# Patient Record
Sex: Male | Born: 1955 | Race: White | Hispanic: No | Marital: Married | Smoking: Former smoker
Health system: Southern US, Community
[De-identification: ages and names within clinical notes are randomized; demographics above are authoritative.]

## PROBLEM LIST (undated history)

## (undated) DIAGNOSIS — M1990 Unspecified osteoarthritis, unspecified site: Secondary | ICD-10-CM

## (undated) DIAGNOSIS — Z8489 Family history of other specified conditions: Secondary | ICD-10-CM

## (undated) DIAGNOSIS — Z9889 Other specified postprocedural states: Secondary | ICD-10-CM

## (undated) DIAGNOSIS — R112 Nausea with vomiting, unspecified: Secondary | ICD-10-CM

## (undated) DIAGNOSIS — Z8709 Personal history of other diseases of the respiratory system: Secondary | ICD-10-CM

## (undated) DIAGNOSIS — Z973 Presence of spectacles and contact lenses: Secondary | ICD-10-CM

## (undated) HISTORY — PX: TONSILLECTOMY: SUR1361

## (undated) HISTORY — PX: SHOULDER ARTHROSCOPY: SHX128

## (undated) HISTORY — PX: ELBOW ARTHROSCOPY: SUR87

## (undated) HISTORY — PX: CARPAL TUNNEL RELEASE: SHX101

## (undated) HISTORY — PX: OTHER SURGICAL HISTORY: SHX169

---

## 2008-10-10 ENCOUNTER — Ambulatory Visit: Payer: Self-pay | Admitting: Internal Medicine

## 2008-10-11 ENCOUNTER — Telehealth: Payer: Self-pay | Admitting: Internal Medicine

## 2008-10-14 ENCOUNTER — Telehealth: Payer: Self-pay | Admitting: Internal Medicine

## 2008-10-18 ENCOUNTER — Encounter: Payer: Self-pay | Admitting: Internal Medicine

## 2008-10-18 ENCOUNTER — Ambulatory Visit: Payer: Self-pay | Admitting: Internal Medicine

## 2008-10-19 ENCOUNTER — Encounter: Payer: Self-pay | Admitting: Internal Medicine

## 2009-02-27 ENCOUNTER — Ambulatory Visit: Payer: Self-pay | Admitting: Specialist

## 2010-08-27 ENCOUNTER — Ambulatory Visit: Payer: Self-pay | Admitting: Family Medicine

## 2010-10-12 ENCOUNTER — Ambulatory Visit: Payer: Self-pay | Admitting: General Practice

## 2010-11-02 ENCOUNTER — Ambulatory Visit: Payer: Self-pay | Admitting: General Practice

## 2011-01-04 LAB — TSH: TSH: 2.52 u[IU]/mL (ref <=5.90)

## 2013-08-11 ENCOUNTER — Encounter: Payer: Self-pay | Admitting: Internal Medicine

## 2014-05-07 ENCOUNTER — Encounter: Payer: Self-pay | Admitting: Internal Medicine

## 2015-03-14 LAB — BASIC METABOLIC PANEL
BUN: 17 mg/dL (ref 4–21)
Creatinine: 0.9 mg/dL (ref 0.6–1.3)
Glucose: 100 mg/dL
Potassium: 4.9 mmol/L (ref 3.4–5.3)
Sodium: 141 mmol/L (ref 137–147)

## 2015-03-14 LAB — LIPID PANEL
Cholesterol: 198 mg/dL (ref 0–200)
HDL: 41 mg/dL (ref 35–70)
LDL Cholesterol: 128 mg/dL
LDl/HDL Ratio: 3.1
Triglycerides: 147 mg/dL (ref 40–160)

## 2015-03-14 LAB — PSA: PSA: 1.3

## 2015-04-25 ENCOUNTER — Ambulatory Visit
Admission: RE | Admit: 2015-04-25 | Discharge: 2015-04-25 | Disposition: A | Discharge status: Home / Self Care | Payer: 59 | Source: Ambulatory Visit | Attending: Gastroenterology | Admitting: Gastroenterology

## 2015-04-25 ENCOUNTER — Ambulatory Visit: Payer: 59 | Admitting: Anesthesiology

## 2015-04-25 ENCOUNTER — Encounter: Payer: Self-pay | Admitting: *Deleted

## 2015-04-25 ENCOUNTER — Encounter
Admission: RE | Disposition: A | Discharge status: Home / Self Care | Payer: Self-pay | Source: Ambulatory Visit | Attending: Gastroenterology

## 2015-04-25 DIAGNOSIS — Z87891 Personal history of nicotine dependence: Secondary | ICD-10-CM | POA: Insufficient documentation

## 2015-04-25 DIAGNOSIS — D120 Benign neoplasm of cecum: Secondary | ICD-10-CM | POA: Insufficient documentation

## 2015-04-25 DIAGNOSIS — D125 Benign neoplasm of sigmoid colon: Secondary | ICD-10-CM | POA: Insufficient documentation

## 2015-04-25 DIAGNOSIS — Z86010 Personal history of colonic polyps: Secondary | ICD-10-CM | POA: Diagnosis not present

## 2015-04-25 DIAGNOSIS — D122 Benign neoplasm of ascending colon: Secondary | ICD-10-CM | POA: Diagnosis not present

## 2015-04-25 DIAGNOSIS — Z79899 Other long term (current) drug therapy: Secondary | ICD-10-CM | POA: Diagnosis not present

## 2015-04-25 DIAGNOSIS — Z1211 Encounter for screening for malignant neoplasm of colon: Secondary | ICD-10-CM | POA: Insufficient documentation

## 2015-04-25 HISTORY — PX: COLONOSCOPY: SHX5424

## 2015-04-25 SURGERY — COLONOSCOPY
Anesthesia: General

## 2015-04-25 MED ORDER — SODIUM CHLORIDE 0.9 % IV SOLN: INTRAVENOUS | Status: DC

## 2015-04-25 MED ORDER — MIDAZOLAM HCL 2 MG/2ML IJ SOLN
INTRAMUSCULAR | Status: DC | PRN
  Administered 2015-04-25: 1 mg via INTRAVENOUS

## 2015-04-25 MED ORDER — PROPOFOL INFUSION 10 MG/ML OPTIME
INTRAVENOUS | Status: DC | PRN
  Administered 2015-04-25: 180 ug/kg/min via INTRAVENOUS

## 2015-04-25 MED ORDER — SODIUM CHLORIDE 0.9 % IV SOLN
INTRAVENOUS | Status: DC
  Administered 2015-04-25: 11:00:00 via INTRAVENOUS

## 2015-04-25 MED ORDER — FENTANYL CITRATE (PF) 100 MCG/2ML IJ SOLN
INTRAMUSCULAR | Status: DC | PRN
  Administered 2015-04-25: 50 ug via INTRAVENOUS

## 2015-04-25 NOTE — Anesthesia Preprocedure Evaluation (Signed)
Anesthesia Evaluation  Patient identified by MRN, date of birth, ID band Patient awake    Reviewed: Allergy & Precautions, NPO status , Patient's Chart, lab work & pertinent test results  Airway Mallampati: II  TM Distance: >3 FB Neck ROM: Full    Dental  (+) Teeth Intact   Pulmonary former smoker (quit x 28 yrs),          Cardiovascular     Neuro/Psych    GI/Hepatic   Endo/Other    Renal/GU      Musculoskeletal   Abdominal   Peds  Hematology   Anesthesia Other Findings   Reproductive/Obstetrics                             Anesthesia Physical Anesthesia Plan  ASA: II  Anesthesia Plan: General   Post-op Pain Management:    Induction: Intravenous  Airway Management Planned: Nasal Cannula  Additional Equipment:   Intra-op Plan:   Post-operative Plan:   Informed Consent: I have reviewed the patients History and Physical, chart, labs and discussed the procedure including the risks, benefits and alternatives for the proposed anesthesia with the patient or authorized representative who has indicated his/her understanding and acceptance.     Plan Discussed with:   Anesthesia Plan Comments:         Anesthesia Quick Evaluation

## 2015-04-25 NOTE — Anesthesia Postprocedure Evaluation (Signed)
  Anesthesia Post-op Note  Patient: Spencer Walker  Procedure(s) Performed: Procedure(s): COLONOSCOPY (N/A)  Anesthesia type:General  Patient location: PACU  Post pain: Pain level controlled  Post assessment: Post-op Vital signs reviewed, Patient's Cardiovascular Status Stable, Respiratory Function Stable, Patent Airway and No signs of Nausea or vomiting  Post vital signs: Reviewed and stable  Last Vitals:  Filed Vitals:   04/25/15 1300  BP: 127/88  Pulse: 57  Temp:   Resp: 18    Level of consciousness: awake, alert  and patient cooperative  Complications: No apparent anesthesia complications

## 2015-04-25 NOTE — Op Note (Signed)
[facility] Gastroenterology Patient Name: Spencer Walker Procedure Date: 04/25/2015 11:46 AM MRN: [id number] Account #: [id number] Date of Birth: [birth year] Admit Type: Outpatient Age: 59 Room: [facility] ENDO ROOM 4 Gender: Male Note Status: Finalized Procedure:         Colonoscopy Indications:       High risk colon cancer surveillance: Personal history of                     colonic polyps Providers:         Lucilla Lame, MD Referring MD:      Kirstie Peri. Caryn Section, MD (Referring MD) Medicines:         Propofol per Anesthesia Complications:     No immediate complications. Procedure:         Pre-Anesthesia Assessment:                    - Prior to the procedure, a History and Physical was                     performed, and patient medications and allergies were                     reviewed. The patient's tolerance of previous anesthesia                     was also reviewed. The risks and benefits of the procedure                     and the sedation options and risks were discussed with the                     patient. All questions were answered, and informed consent                     was obtained. Prior Anticoagulants: The patient has taken                     no previous anticoagulant or antiplatelet agents. ASA                     Grade Assessment: II - A patient with mild systemic                     disease. After reviewing the risks and benefits, the                     patient was deemed in satisfactory condition to undergo                     the procedure.                    After obtaining informed consent, the colonoscope was                     passed under direct vision. Throughout the procedure, the                     patient's blood pressure, pulse, and oxygen saturations                     were monitored continuously. The Olympus CF-Q160AL  colonoscope (S#. [id number]) was introduced through the anus                     and  advanced to the the cecum, identified by appendiceal                     orifice and ileocecal valve. The colonoscopy was performed                     without difficulty. The patient tolerated the procedure                     well. The quality of the bowel preparation was excellent. Findings:      The perianal and digital rectal examinations were normal.      A 3 mm polyp was found in the cecum. The polyp was sessile. The polyp       was removed with a cold biopsy forceps. Resection and retrieval were       complete.      A 7 mm polyp was found in the ascending colon. The polyp was sessile.       The polyp was removed with a cold snare. Resection and retrieval were       complete.      A 4 mm polyp was found in the sigmoid colon. The polyp was sessile. The       polyp was removed with a cold biopsy forceps. Resection and retrieval       were complete. Impression:        - One 3 mm polyp in the cecum. Resected and retrieved.                    - One 7 mm polyp in the ascending colon. Resected and                     retrieved.                    - One 4 mm polyp in the sigmoid colon. Resected and                     retrieved. Recommendation:    - Await pathology results.                    - Repeat colonoscopy in 5 years for surveillance. Procedure Code(s): --- Professional ---                    [phone], Colonoscopy, flexible; with removal of tumor(s),                     polyp(s), or other lesion(s) by snare technique                    45380, 65, Colonoscopy, flexible; with biopsy, single or                     multiple Diagnosis Code(s): --- Professional ---                    Z86.010, Personal history of colonic polyps                    D12.0, Benign neoplasm of cecum  D12.2, Benign neoplasm of ascending colon                    D12.5, Benign neoplasm of sigmoid colon CPT copyright 2014 American Medical Association. All rights reserved. The codes documented in  this report are preliminary and upon coder review may  be revised to meet current compliance requirements. Lucilla Lame, MD 04/25/2015 12:15:21 PM This report has been signed electronically. Number of Addenda: 0 Note Initiated On: 04/25/2015 11:46 AM Scope Withdrawal Time: 0 hours 9 minutes 31 seconds  Total Procedure Duration: 0 hours 12 minutes 20 seconds       [facility]

## 2015-04-25 NOTE — Anesthesia Procedure Notes (Signed)
Performed by: Jonna Clark Pre-anesthesia Checklist: Patient identified, Emergency Drugs available, Patient being monitored, Suction available and Timeout performed Patient Re-evaluated:Patient Re-evaluated prior to inductionOxygen Delivery Method: Nasal cannula Preoxygenation: Pre-oxygenation with 100% oxygen Intubation Type: IV induction

## 2015-04-25 NOTE — Transfer of Care (Signed)
Immediate Anesthesia Transfer of Care Note  Patient: Spencer Walker  Procedure(s) Performed: Procedure(s): COLONOSCOPY (N/A)  Patient Location: PACU and Endoscopy Unit  Anesthesia Type:General  Level of Consciousness: awake, alert  and oriented  Airway & Oxygen Therapy: Patient Spontanous Breathing and Patient connected to nasal cannula oxygen  Post-op Assessment: Report given to RN and Post -op Vital signs reviewed and stable  Post vital signs: Reviewed and stable  Last Vitals:  Filed Vitals:   04/25/15 1043  BP: 141/99  Pulse: 68  Temp: 36.6 C  Resp: 14    Complications: No apparent anesthesia complications

## 2015-04-25 NOTE — H&P (Signed)
  [facility]  [address], [place], [place] [address] Phone: [phone] Fax : [phone]  Primary Care Physician:  Lelon Huh, MD Primary Gastroenterologist:  Dr. Allen Norris  Pre-Procedure History & Physical: HPI:  Spencer Walker is a 59 y.o. male is here for an colonoscopy.   History reviewed. No pertinent past medical history.  Past Surgical History  Procedure Laterality Date  . Carpal tunnel release    . Shoulder arthroscopy    . Elbow arthroscopy      Prior to Admission medications   Medication Sig Start Date End Date Taking? Authorizing Provider  sildenafil (VIAGRA) 50 MG tablet Take 50 mg by mouth daily as needed for erectile dysfunction.   Yes Historical Provider, MD  zolpidem (AMBIEN) 10 MG tablet Take 10 mg by mouth at bedtime as needed. 04/12/15  Yes Historical Provider, MD    Allergies as of 04/11/2015  . (Not on File)    History reviewed. No pertinent family history.  History   Social History  . Marital Status: Married    Spouse Name: N/A  . Number of Children: N/A  . Years of Education: N/A   Occupational History  . Not on file.   Social History Main Topics  . Smoking status: Former Research scientist (life sciences)  . Smokeless tobacco: Never Used  . Alcohol Use: No  . Drug Use: No  . Sexual Activity: Not on file   Other Topics Concern  . Not on file   Social History Narrative  . No narrative on file    Review of Systems: See HPI, otherwise negative ROS  Physical Exam: BP 141/99 mmHg  Pulse 68  Temp(Src) 97.8 F (36.6 C) (Tympanic)  Resp 14  Ht 6\' 2"  (1.88 m)  Wt 246 lb (111.585 kg)  BMI 31.57 kg/m2  SpO2 100% General:   Alert,  pleasant and cooperative in NAD Head:  Normocephalic and atraumatic. Neck:  Supple; no masses or thyromegaly. Lungs:  Clear throughout to auscultation.    Heart:  Regular rate and rhythm. Abdomen:  Soft, nontender and nondistended. Normal bowel sounds, without guarding, and without rebound.     Neurologic:  Alert and  oriented x4;  grossly normal neurologically.  Impression/Plan: Spencer Walker is here for an colonoscopy to be performed for history of colon polyps.  Risks, benefits, limitations, and alternatives regarding  colonoscopy have been reviewed with the patient.  Questions have been answered.  All parties agreeable.   [facility], MD  04/25/2015, 11:08 AM

## 2015-04-26 LAB — SURGICAL PATHOLOGY

## 2015-05-02 ENCOUNTER — Encounter: Payer: Self-pay | Admitting: Gastroenterology

## 2015-05-22 ENCOUNTER — Other Ambulatory Visit: Payer: Self-pay | Admitting: Family Medicine

## 2015-05-22 NOTE — Telephone Encounter (Signed)
Please call in:  [facility] [phone]   [place]  1 hour ago   (9:42 AM)               Requested Medications     Medication name:  Name from pharmacy:  zolpidem (AMBIEN) 10 MG tablet ZOLPIDEM TARTRATE 10 MG TABLET    Sig: TAKE 1 TABLET AT BEDTIME AS NEEDED   #30, rf x 5

## 2015-05-23 ENCOUNTER — Other Ambulatory Visit: Payer: Self-pay | Admitting: *Deleted

## 2015-05-23 MED ORDER — ZOLPIDEM TARTRATE 10 MG PO TABS: 10.0000 mg | ORAL_TABLET | Freq: Every evening | ORAL | Status: DC | PRN

## 2015-05-23 NOTE — Telephone Encounter (Signed)
Rx phoned into pharmacy. [facility]

## 2015-05-23 NOTE — Telephone Encounter (Signed)
Pt stated that [facility] would no longer be excepting [facility]'s insurance starting in July so pt request that we send the RX for Ambien 10 mg to [facility] on [address]. Thanks TNP

## 2015-05-23 NOTE — Telephone Encounter (Signed)
Rx phoned into pharmacy.

## 2015-11-24 ENCOUNTER — Encounter: Payer: Self-pay | Admitting: Family Medicine

## 2015-11-24 ENCOUNTER — Ambulatory Visit (INDEPENDENT_AMBULATORY_CARE_PROVIDER_SITE_OTHER): Payer: 59 | Admitting: Family Medicine

## 2015-11-24 ENCOUNTER — Other Ambulatory Visit: Payer: Self-pay | Admitting: *Deleted

## 2015-11-24 VITALS — BP 134/70 | HR 76 | Temp 97.9°F | Resp 16 | Ht 74.0 in | Wt 254.0 lb

## 2015-11-24 DIAGNOSIS — J069 Acute upper respiratory infection, unspecified: Secondary | ICD-10-CM | POA: Diagnosis not present

## 2015-11-24 MED ORDER — ZOLPIDEM TARTRATE 10 MG PO TABS: 10.0000 mg | ORAL_TABLET | Freq: Every evening | ORAL | Status: DC | PRN

## 2015-11-24 MED ORDER — HYDROCODONE-HOMATROPINE 5-1.5 MG/5ML PO SYRP: ORAL_SOLUTION | ORAL | Status: DC

## 2015-11-24 NOTE — Telephone Encounter (Signed)
Please call in zolpidem  

## 2015-11-24 NOTE — Patient Instructions (Signed)
May use Mucinex for an expectorant.

## 2015-11-24 NOTE — Progress Notes (Signed)
Subjective:     Patient ID: Spencer Walker, male   DOB: 05-Sep-1956, 59 y.o.   MRN: [id number]  HPI  Chief Complaint  Patient presents with  . URI    X 1 week. Patient reports that he has had a productive cough with some wheezing and some shortness of breath. Denies fever. Patient reports that he has been taking OTC Alka seltzer plus which has not been helping.   States his sinus congestion has improved but he continues to have a dry>productive cough which is keeping him up at night:"I feel hot". Consequently feels malaise, weak, and his chest discomfort with cough. Wishes to be checked for pneumonia.   Review of Systems  Constitutional: Negative for fever and chills.       Objective:   Physical Exam  Constitutional: He appears well-developed and well-nourished. No distress.  Ears: T.M's intact without inflammation Throat: tonsils absent, no erythema Neck: no cervical adenopathy Lungs: clear     Assessment:    1. Upper respiratory infection - HYDROcodone-homatropine (HYCODAN) 5-1.5 MG/5ML syrup; 5 ml 4-6 hours as needed for cough  Dispense: 240 mL; Refill: 0    Plan:    Call if not continuing to improve over the next week.

## 2015-11-24 NOTE — Telephone Encounter (Signed)
Rx called in to pharmacy. 

## 2015-11-29 ENCOUNTER — Other Ambulatory Visit: Payer: Self-pay | Admitting: Family Medicine

## 2016-03-14 ENCOUNTER — Encounter: Payer: Self-pay | Admitting: Family Medicine

## 2016-03-14 ENCOUNTER — Ambulatory Visit (INDEPENDENT_AMBULATORY_CARE_PROVIDER_SITE_OTHER): Payer: BLUE CROSS/BLUE SHIELD | Admitting: Family Medicine

## 2016-03-14 VITALS — BP 112/78 | HR 67 | Temp 97.7°F | Resp 16 | Ht 73.75 in | Wt 246.0 lb

## 2016-03-14 DIAGNOSIS — Z0000 Encounter for general adult medical examination without abnormal findings: Secondary | ICD-10-CM

## 2016-03-14 DIAGNOSIS — G4700 Insomnia, unspecified: Secondary | ICD-10-CM

## 2016-03-14 NOTE — Progress Notes (Signed)
Patient: Spencer Walker, Male    DOB: 1956-08-11, 60 y.o.   MRN: [id number] Visit Date: 03/14/2016  Today's Provider: Lelon Huh, MD   Chief Complaint  Patient presents with  . Annual Exam  . Insomnia    follow up   Subjective:    Annual physical exam Spencer Walker is a 60 y.o. male who presents today for health maintenance and complete physical. He feels fairly well. He reports exercising on the job. He reports he is sleeping poorly.  ----------------------------------------------------------------- Follow up Insomnia: Last office visit was several months ago and no changes were made. Patient reports good compliance with treatment, good tolerance and fair symptom control. Patient states he usually sleeps 4-6 hours each night.   Review of Systems  Constitutional: Negative for fever, chills, appetite change and fatigue.  HENT: Negative for congestion, ear pain, hearing loss, nosebleeds and trouble swallowing.   Eyes: Negative for pain and visual disturbance.  Respiratory: Negative for cough, chest tightness and shortness of breath.   Cardiovascular: Negative for chest pain, palpitations and leg swelling.  Gastrointestinal: Negative for nausea, vomiting, abdominal pain, diarrhea, constipation and blood in stool.  Endocrine: Negative for polydipsia, polyphagia and polyuria.  Genitourinary: Negative for dysuria and flank pain.  Musculoskeletal: Negative for myalgias, back pain, joint swelling, arthralgias and neck stiffness.  Skin: Negative for color change, rash and wound.  Neurological: Negative for dizziness, tremors, seizures, speech difficulty, weakness, light-headedness and headaches.  Psychiatric/Behavioral: Positive for sleep disturbance. Negative for behavioral problems, confusion, dysphoric mood and decreased concentration. The patient is not nervous/anxious.   All other systems reviewed and are negative.   Social History      He  reports that he has  quit smoking. He has never used smokeless tobacco. He reports that he does not drink alcohol or use illicit drugs.       Social History   Social History  . Marital Status: Married    Spouse Name: N/A  . Number of Children: 1  . Years of Education: N/A   Occupational History  . Self Employed    Social History Main Topics  . Smoking status: Former Research scientist (life sciences)  . Smokeless tobacco: Never Used  . Alcohol Use: No  . Drug Use: No  . Sexual Activity: Not Asked   Other Topics Concern  . None   Social History Narrative    No past medical history on file.   Patient Active Problem List   Diagnosis Date Noted  . History of colon polyps 10/24/2008  . 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 Cannot sleep 12/09/2004    Past Surgical History  Procedure Laterality Date  . Carpal tunnel release    . Shoulder arthroscopy    . Elbow arthroscopy    . Colonoscopy N/A 04/25/2015    Procedure: COLONOSCOPY;  Surgeon: Lucilla Lame, MD;  Location: [facility] ENDOSCOPY;  Service: Endoscopy;  Laterality: N/A;    Family History        Family Status  Relation Status Death Age  . Mother Deceased 73  . Father Deceased     in his 85s  . Sister Alive   . Brother Alive   . Brother Alive   . Brother Alive   . Brother Alive   . Sister Alive   . Sister Alive   . Sister Alive         His family history includes Diabetes  in his father; Heart attack in his father; Pulmonary fibrosis in his mother.    No Known Allergies  Previous Medications   VIAGRA 100 MG TABLET    TAKE 1/2 TO 1 TABLET BY MOUTH UP TO EVERY DAY   ZOLPIDEM (AMBIEN) 10 MG TABLET    Take 1 tablet (10 mg total) by mouth at bedtime as needed.    Patient Care Team: Birdie Sons, MD as PCP - General (Family Medicine)     Objective:   Vitals: BP 112/78 mmHg  Pulse 67  Temp(Src) 97.7 F (36.5 C) (Oral)  Resp 16  Ht 6' 1.75" (1.873 m)  Wt 246 lb (111.585 kg)  BMI 31.81 kg/m2  SpO2  95%   Physical Exam   General Appearance:    Alert, cooperative, no distress, appears stated age  Head:    Normocephalic, without obvious abnormality, atraumatic  Eyes:    PERRL, conjunctiva/corneas clear, EOM's intact, fundi    benign, both eyes       Ears:    Normal TM's and external ear canals, both ears  Nose:   Nares normal, septum midline, mucosa normal, no drainage   or sinus tenderness  Throat:   Lips, mucosa, and tongue normal; teeth and gums normal  Neck:   Supple, symmetrical, trachea midline, no adenopathy;       thyroid:  No enlargement/tenderness/nodules; no carotid   bruit or JVD  Back:     Symmetric, no curvature, ROM normal, no CVA tenderness  Lungs:     Clear to auscultation bilaterally, respirations unlabored  Chest wall:    No tenderness or deformity  Heart:    Regular rate and rhythm, S1 and S2 normal, no murmur, rub   or gallop  Abdomen:     Soft, non-tender, bowel sounds active all four quadrants,    no masses, no organomegaly  Genitalia:    deferred  Rectal:    deferred  Extremities:   Extremities normal, atraumatic, no cyanosis or edema  Pulses:   2+ and symmetric all extremities  Skin:   Skin color, texture, turgor normal, no rashes or lesions  Lymph nodes:   Cervical, supraclavicular, and axillary nodes normal  Neurologic:   CNII-XII intact. Normal strength, sensation and reflexes      throughout      Depression Screen PHQ 2/9 Scores 03/14/2016  PHQ - 2 Score 0  PHQ- 9 Score 4      Assessment & Plan:     Routine Health Maintenance and Physical Exam  Exercise Activities and Dietary recommendations Goals    None      Immunization History  Administered Date(s) Administered  . Tdap 12/26/2009    Health Maintenance  Topic Date Due  . Hepatitis C Screening  [birth year]  . HIV Screening  12/08/1973  . INFLUENZA VACCINE  07/09/2016  . TETANUS/TDAP  12/27/2019  . COLONOSCOPY  04/24/2020      Discussed health benefits of physical  activity, and encouraged him to engage in regular exercise appropriate for his age and condition.    -------------------------------------------------------------------- 1. Annual physical exam Doing well. Discussed exercise to ose weight. 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 Insomnia Doing well with prn zolpidem.

## 2016-03-15 ENCOUNTER — Telehealth: Payer: Self-pay

## 2016-03-15 LAB — COMPREHENSIVE METABOLIC PANEL
ALK PHOS: 54 IU/L (ref 39–117)
ALT: 14 IU/L (ref 0–44)
AST: 20 IU/L (ref 0–40)
Albumin/Globulin Ratio: 2.1 (ref 1.2–2.2)
Albumin: 4.6 g/dL (ref 3.5–5.5)
BUN/Creatinine Ratio: 24 — ABNORMAL HIGH (ref 9–20)
BUN: 19 mg/dL (ref 6–24)
Bilirubin Total: 1.1 mg/dL (ref 0.0–1.2)
CO2: 24 mmol/L (ref 18–29)
Calcium: 9.3 mg/dL (ref 8.7–10.2)
Chloride: 100 mmol/L (ref 96–106)
Creatinine, Ser: 0.78 mg/dL (ref 0.76–1.27)
GFR calc Af Amer: 116 mL/min/{1.73_m2} (ref >59)
GFR calc non Af Amer: 100 mL/min/{1.73_m2} (ref >59)
GLUCOSE: 103 mg/dL — AB (ref 65–99)
Globulin, Total: 2.2 g/dL (ref 1.5–4.5)
Potassium: 4.8 mmol/L (ref 3.5–5.2)
Sodium: 140 mmol/L (ref 134–144)
Total Protein: 6.8 g/dL (ref 6.0–8.5)

## 2016-03-15 LAB — HEPATITIS C ANTIBODY: Hep C Virus Ab: <0.1 {s_co_ratio} (ref 0.0–0.9)

## 2016-03-15 LAB — LIPID PANEL
Chol/HDL Ratio: 4.7 ratio units (ref 0.0–5.0)
Cholesterol, Total: 216 mg/dL — ABNORMAL HIGH (ref 100–199)
HDL: 46 mg/dL (ref >39)
LDL Calculated: 143 mg/dL — ABNORMAL HIGH (ref 0–99)
Triglycerides: 137 mg/dL (ref 0–149)
VLDL CHOLESTEROL CAL: 27 mg/dL (ref 5–40)

## 2016-03-15 LAB — PSA: PROSTATE SPECIFIC AG, SERUM: 1.1 ng/mL (ref 0.0–4.0)

## 2016-03-15 NOTE — Telephone Encounter (Signed)
Left message to call back  

## 2016-03-15 NOTE — Telephone Encounter (Signed)
Advised patient as below.  

## 2016-03-15 NOTE — Telephone Encounter (Signed)
-----   Message from Birdie Sons, MD sent at 03/15/2016  7:56 AM EDT ----- Cholesterol is mildly elevated at 216, should be under 200. Cut back on saturated fats in diet such as red meats and dairy. Otherwise labs are normal. Check yearly.

## 2016-04-22 ENCOUNTER — Ambulatory Visit
Admission: RE | Admit: 2016-04-22 | Discharge: 2016-04-22 | Disposition: A | Discharge status: Home / Self Care | Payer: BLUE CROSS/BLUE SHIELD | Source: Ambulatory Visit | Attending: Family Medicine | Admitting: Family Medicine

## 2016-04-22 ENCOUNTER — Ambulatory Visit (INDEPENDENT_AMBULATORY_CARE_PROVIDER_SITE_OTHER): Payer: BLUE CROSS/BLUE SHIELD | Admitting: Family Medicine

## 2016-04-22 ENCOUNTER — Encounter: Payer: Self-pay | Admitting: Family Medicine

## 2016-04-22 VITALS — BP 120/84 | HR 66 | Temp 98.2°F | Resp 16 | Ht 73.75 in | Wt 247.0 lb

## 2016-04-22 DIAGNOSIS — M62838 Other muscle spasm: Secondary | ICD-10-CM | POA: Insufficient documentation

## 2016-04-22 DIAGNOSIS — M542 Cervicalgia: Secondary | ICD-10-CM | POA: Diagnosis not present

## 2016-04-22 DIAGNOSIS — M546 Pain in thoracic spine: Secondary | ICD-10-CM | POA: Insufficient documentation

## 2016-04-22 DIAGNOSIS — M50321 Other cervical disc degeneration at C4-C5 level: Secondary | ICD-10-CM | POA: Diagnosis not present

## 2016-04-22 MED ORDER — PREDNISONE 10 MG PO TABS: ORAL_TABLET | ORAL | Status: AC

## 2016-04-22 MED ORDER — CYCLOBENZAPRINE HCL 10 MG PO TABS: 10.0000 mg | ORAL_TABLET | Freq: Three times a day (TID) | ORAL | Status: AC | PRN

## 2016-04-22 NOTE — Patient Instructions (Signed)
   Go to the [facility] on [address] for back Xray  

## 2016-04-22 NOTE — Progress Notes (Signed)
Patient: Spencer Walker Male    DOB: 1956-07-20   60 y.o.   MRN: [id number] Visit Date: 04/22/2016  Today's Provider: Lelon Huh, MD   Chief Complaint  Patient presents with  . Back Pain   Subjective:    Back Pain This is a recurrent problem. The current episode started more than 1 year ago. The problem occurs constantly. The problem has been gradually worsening since onset. The pain is present in the thoracic spine. The quality of the pain is described as stabbing. The pain radiates to the left thigh and right thigh. The pain is at a severity of 5/10. The pain is moderate. The pain is the same all the time. The symptoms are aggravated by bending, position, twisting and standing. Stiffness is present all day. Associated symptoms include leg pain, numbness, tingling and weakness. Pertinent negatives include no abdominal pain, bladder incontinence, bowel incontinence, chest pain, dysuria, fever, headaches, paresis, paresthesias, pelvic pain or perianal numbness. He has tried NSAIDs (ibuprofen) for the symptoms. The treatment provided mild relief.  \ Had sudden exacerbation of pain when moving ladder on 04/19/16 and couldn't move for 10-15 minutes. Had similar episodes about 3 times over the weekend. Pain in mid back that radiates to legs. Right has numbness and tingling.     No Known Allergies Previous Medications   VIAGRA 100 MG TABLET    TAKE 1/2 TO 1 TABLET BY MOUTH UP TO EVERY DAY   ZOLPIDEM (AMBIEN) 10 MG TABLET    Take 1 tablet (10 mg total) by mouth at bedtime as needed.    Review of Systems  Constitutional: Negative for fever, chills and appetite change.  Respiratory: Negative for chest tightness, shortness of breath and wheezing.   Cardiovascular: Negative for chest pain and palpitations.  Gastrointestinal: Negative for nausea, vomiting, abdominal pain and bowel incontinence.  Genitourinary: Negative for bladder incontinence, dysuria and pelvic pain.    Musculoskeletal: Positive for back pain and neck pain.  Neurological: Positive for tingling, weakness and numbness. Negative for headaches and paresthesias.    Social History  Substance Use Topics  . Smoking status: Former Research scientist (life sciences)  . Smokeless tobacco: Never Used  . Alcohol Use: No   Objective:   BP 120/84 mmHg  Pulse 66  Temp(Src) 98.2 F (36.8 C) (Oral)  Resp 16  Ht 6' 1.75" (1.873 m)  Wt 247 lb (112.038 kg)  BMI 31.94 kg/m2  SpO2 97%  Physical Exam   General Appearance:    Alert, cooperative, no distress  Eyes:    PERRL, conjunctiva/corneas clear, EOM's intact       Lungs:     Clear to auscultation bilaterally, respirations unlabored  Heart:    Regular rate and rhythm  Neurologic:   Awake, alert, oriented x 3. No apparent focal neurological           defect.   MS:   Diffuse tenderness thoracic spine with spasm of para-spinous muscles noted.        Assessment & Plan:     1. Bilateral thoracic back pain  - DG Thoracic Spine W/Swimmers; Future  2. Cervical spine pain  - DG Cervical Spine Complete; Future - predniSONE (DELTASONE) 10 MG tablet; 6 tablets for 2 days, then 5 for 2 days, then 4 for 2 days, then 3 for 2 days, then 2 for 2 days, then 1 for 2 days.  Dispense: 42 tablet; Refill: 0 - cyclobenzaprine (FLEXERIL) 10 MG tablet; Take 1 tablet (  10 mg total) by mouth 3 (three) times daily as needed for muscle spasms.  Dispense: 30 tablet; Refill: 0     The entirety of the information documented in the History of Present Illness, Review of Systems and Physical Exam were personally obtained by me. Portions of this information were initially documented by April M. Sabra Heck, CMA and reviewed by me for thoroughness and accuracy.    Lelon Huh, MD  [facility]

## 2016-04-23 ENCOUNTER — Telehealth: Payer: Self-pay | Admitting: Family Medicine

## 2016-04-23 NOTE — Telephone Encounter (Signed)
Patient was notified of results. Patient expressed understanding. 

## 2016-04-23 NOTE — Telephone Encounter (Signed)
Pt is calling to request results from X-Ray.  CB#[phone]/MW

## 2016-05-23 ENCOUNTER — Other Ambulatory Visit: Payer: Self-pay | Admitting: Family Medicine

## 2016-05-23 NOTE — Telephone Encounter (Signed)
Please call in zolpidem  

## 2016-05-23 NOTE — Telephone Encounter (Signed)
Rx called in to pharmacy. 

## 2016-07-08 ENCOUNTER — Telehealth: Payer: Self-pay

## 2016-07-08 ENCOUNTER — Other Ambulatory Visit: Payer: Self-pay

## 2016-07-08 DIAGNOSIS — M5020 Other cervical disc displacement, unspecified cervical region: Secondary | ICD-10-CM

## 2016-07-08 MED ORDER — SILDENAFIL CITRATE 100 MG PO TABS: ORAL_TABLET | ORAL | 2 refills | Status: DC

## 2016-07-08 NOTE — Telephone Encounter (Signed)
Pharmacy faxed refill request. 

## 2016-07-08 NOTE — Telephone Encounter (Signed)
Patient is requesting a referral to see a neurologist in [place] (Dr. Christella Noa) for his chonic neck pain. He reports that Dr. Caryn Section has seen for this before in the past, and it has worsened. Please call when referral has been scheduled. Thanks!

## 2016-07-09 NOTE — Telephone Encounter (Signed)
Please refer Dr. Ashok Pall neurosurgery for herniated cervical disk. Thanks.

## 2016-08-08 ENCOUNTER — Other Ambulatory Visit (HOSPITAL_COMMUNITY): Payer: Self-pay | Admitting: Neurosurgery

## 2016-08-08 DIAGNOSIS — M4722 Other spondylosis with radiculopathy, cervical region: Secondary | ICD-10-CM

## 2016-08-20 ENCOUNTER — Ambulatory Visit (HOSPITAL_COMMUNITY): Payer: BLUE CROSS/BLUE SHIELD

## 2016-09-02 ENCOUNTER — Ambulatory Visit (HOSPITAL_COMMUNITY)
Admission: RE | Admit: 2016-09-02 | Discharge: 2016-09-02 | Disposition: A | Discharge status: Home / Self Care | Payer: BLUE CROSS/BLUE SHIELD | Source: Ambulatory Visit | Attending: Neurosurgery | Admitting: Neurosurgery

## 2016-09-02 DIAGNOSIS — M50223 Other cervical disc displacement at C6-C7 level: Secondary | ICD-10-CM | POA: Insufficient documentation

## 2016-09-02 DIAGNOSIS — M50221 Other cervical disc displacement at C4-C5 level: Secondary | ICD-10-CM | POA: Diagnosis not present

## 2016-09-02 DIAGNOSIS — M4802 Spinal stenosis, cervical region: Secondary | ICD-10-CM | POA: Diagnosis not present

## 2016-09-02 DIAGNOSIS — M2578 Osteophyte, vertebrae: Secondary | ICD-10-CM | POA: Diagnosis not present

## 2016-09-02 DIAGNOSIS — M4722 Other spondylosis with radiculopathy, cervical region: Secondary | ICD-10-CM

## 2016-09-03 ENCOUNTER — Other Ambulatory Visit: Payer: Self-pay | Admitting: Neurosurgery

## 2016-09-09 ENCOUNTER — Encounter (HOSPITAL_COMMUNITY): Payer: Self-pay

## 2016-09-09 NOTE — Pre-Procedure Instructions (Signed)
    Spencer Walker  09/09/2016      [id number] [address], [place] [address] Phone: [phone] Fax: [phone]    Your procedure is scheduled on Wednesday, October 4th, 2017.  Report to [facility] Admitting at 8:30 A.M.   Call this number if you have problems the morning of surgery:  [phone]   Remember:  Do not eat food or drink liquids after midnight.   Take these medicines the morning of surgery with A SIP OF WATER: None.  Stop taking: Ibuprofen, Advil, Motrin, Aspirin, NSAIDS, Aleve, Naproxen, BC's, Goody's, Fish oil, all herbal medications, and all vitamins.    Do not wear jewelry.  Do not wear lotions, powders, or colognes, or deoderant.  Men may shave face and neck.  Do not bring valuables to the hospital.  [facility] is not responsible for any belongings or valuables.  Contacts, dentures or bridgework may not be worn into surgery.  Leave your suitcase in the car.  After surgery it may be brought to your room.  For patients admitted to the hospital, discharge time will be determined by your treatment team.  Patients discharged the day of surgery will not be allowed to drive home.   Special instructions:  Preparing for Surgery.   Please read over the following fact sheets that you were given. MRSA Information    [facility]- Preparing For Surgery  Before surgery, you can play an important role. Because skin is not sterile, your skin needs to be as free of germs as possible. You can reduce the number of germs on your skin by washing with CHG (chlorahexidine gluconate) Soap before surgery.  CHG is an antiseptic cleaner which kills germs and bonds with the skin to continue killing germs even after washing.  Please do not use if you have an allergy to CHG or antibacterial soaps. If your skin becomes reddened/irritated stop using the CHG.  Do not shave (including legs and underarms) for at  least 48 hours prior to first CHG shower. It is OK to shave your face.  Please follow these instructions carefully.   1. Shower the NIGHT BEFORE SURGERY and the MORNING OF SURGERY with CHG.   2. If you chose to wash your hair, wash your hair first as usual with your normal shampoo.  3. After you shampoo, rinse your hair and body thoroughly to remove the shampoo.  4. Use CHG as you would any other liquid soap. You can apply CHG directly to the skin and wash gently with a scrungie or a clean washcloth.   5. Apply the CHG Soap to your body ONLY FROM THE NECK DOWN.  Do not use on open wounds or open sores. Avoid contact with your eyes, ears, mouth and genitals (private parts). Wash genitals (private parts) with your normal soap.  6. Wash thoroughly, paying special attention to the area where your surgery will be performed.  7. Thoroughly rinse your body with warm water from the neck down.  8. DO NOT shower/wash with your normal soap after using and rinsing off the CHG Soap.  9. Pat yourself dry with a CLEAN TOWEL.   10. Wear CLEAN PAJAMAS   11. Place CLEAN SHEETS on your bed the night of your first shower and DO NOT SLEEP WITH PETS.  Day of Surgery: Do not apply any deodorants/lotions. Please wear clean clothes to the hospital/surgery center.

## 2016-09-10 ENCOUNTER — Encounter (HOSPITAL_COMMUNITY)
Admission: RE | Admit: 2016-09-10 | Discharge: 2016-09-10 | Disposition: A | Discharge status: Home / Self Care | Payer: BLUE CROSS/BLUE SHIELD | Source: Ambulatory Visit | Attending: Neurosurgery | Admitting: Neurosurgery

## 2016-09-10 ENCOUNTER — Encounter (HOSPITAL_COMMUNITY): Payer: Self-pay

## 2016-09-10 DIAGNOSIS — I440 Atrioventricular block, first degree: Secondary | ICD-10-CM | POA: Diagnosis not present

## 2016-09-10 DIAGNOSIS — M1990 Unspecified osteoarthritis, unspecified site: Secondary | ICD-10-CM | POA: Diagnosis not present

## 2016-09-10 DIAGNOSIS — Z87891 Personal history of nicotine dependence: Secondary | ICD-10-CM | POA: Diagnosis not present

## 2016-09-10 DIAGNOSIS — M4722 Other spondylosis with radiculopathy, cervical region: Secondary | ICD-10-CM | POA: Diagnosis present

## 2016-09-10 HISTORY — DX: Presence of spectacles and contact lenses: Z97.3

## 2016-09-10 HISTORY — DX: Unspecified osteoarthritis, unspecified site: M19.90

## 2016-09-10 HISTORY — DX: Family history of other specified conditions: Z84.89

## 2016-09-10 HISTORY — DX: Personal history of other diseases of the respiratory system: Z87.09

## 2016-09-10 HISTORY — DX: Nausea with vomiting, unspecified: R11.2

## 2016-09-10 HISTORY — DX: Other specified postprocedural states: Z98.890

## 2016-09-10 LAB — CBC
HEMATOCRIT: 49.9 % (ref 39.0–52.0)
HEMOGLOBIN: 17.3 g/dL — AB (ref 13.0–17.0)
MCH: 30.5 pg (ref 26.0–34.0)
MCHC: 34.7 g/dL (ref 30.0–36.0)
MCV: 87.9 fL (ref 78.0–100.0)
Platelets: 233 10*3/uL (ref 150–400)
RBC: 5.68 MIL/uL (ref 4.22–5.81)
RDW: 12.7 % (ref 11.5–15.5)
WBC: 6.3 10*3/uL (ref 4.0–10.5)

## 2016-09-10 LAB — BASIC METABOLIC PANEL
ANION GAP: 5 (ref 5–15)
BUN: 19 mg/dL (ref 6–20)
CO2: 29 mmol/L (ref 22–32)
Calcium: 9.2 mg/dL (ref 8.9–10.3)
Chloride: 107 mmol/L (ref 101–111)
Creatinine, Ser: 0.87 mg/dL (ref 0.61–1.24)
GLUCOSE: 105 mg/dL — AB (ref 65–99)
POTASSIUM: 4.3 mmol/L (ref 3.5–5.1)
SODIUM: 141 mmol/L (ref 135–145)

## 2016-09-10 LAB — SURGICAL PCR SCREEN
MRSA, PCR: NEGATIVE
Staphylococcus aureus: NEGATIVE

## 2016-09-10 NOTE — Progress Notes (Signed)
PCP - Dr. Lelon Huh Cardiologist - denies  EKG - 03/14/16 CXR - denies Echo/cardiac cath - denies Stress test - more than 10 years ago  Patient denies chest pain and shortness of breath at PAT appointment.

## 2016-09-10 NOTE — Anesthesia Preprocedure Evaluation (Addendum)
Anesthesia Evaluation  Patient identified by MRN, date of birth, ID band Patient awake    Reviewed: Allergy & Precautions, NPO status , Patient's Chart, lab work & pertinent test results  History of Anesthesia Complications (+) PONV, Family history of anesthesia reaction and history of anesthetic complications  Airway Mallampati: III  TM Distance: >3 FB Neck ROM: Full   Comment: Beard and mustache Dental  (+) Teeth Intact, Dental Advisory Given   Pulmonary neg shortness of breath, neg sleep apnea, neg COPD, neg recent URI, former smoker (quit in 1988),    breath sounds clear to auscultation       Cardiovascular Exercise Tolerance: Good (-) hypertension(-) angina(-) Past MI, (-) Cardiac Stents, (-) CABG, (-) Orthopnea, (-) PND and (-) DOE + dysrhythmias (1st degree AV block)  Rhythm:Regular Rate:Normal  EKG 03/14/2016: Sinus rhythm with 1st degree AV block   Neuro/Psych neg Seizures Cervical spondylosis with radiculopathy    GI/Hepatic negative GI ROS, Neg liver ROS,   Endo/Other  negative endocrine ROS  Renal/GU negative Renal ROS     Musculoskeletal  (+) Arthritis ,   Abdominal   Peds  Hematology negative hematology ROS (+)   Anesthesia Other Findings   Reproductive/Obstetrics                           Anesthesia Physical Anesthesia Plan  ASA: II  Anesthesia Plan: General   Post-op Pain Management:    Induction: Intravenous  Airway Management Planned: Oral ETT  Additional Equipment:   Intra-op Plan:   Post-operative Plan: Extubation in OR  Informed Consent: I have reviewed the patients History and Physical, chart, labs and discussed the procedure including the risks, benefits and alternatives for the proposed anesthesia with the patient or authorized representative who has indicated his/her understanding and acceptance.   Dental advisory given  Plan Discussed with:  CRNA  Anesthesia Plan Comments: (Risks of general anesthesia discussed including, but not limited to, sore throat, hoarse voice, chipped/damaged teeth, injury to vocal cords, nausea and vomiting, allergic reactions, lung infection, heart attack, stroke, and death. All questions answered. )       Anesthesia Quick Evaluation

## 2016-09-11 ENCOUNTER — Encounter (HOSPITAL_COMMUNITY)
Admission: RE | Disposition: A | Discharge status: Home / Self Care | Payer: Self-pay | Source: Ambulatory Visit | Attending: Neurosurgery

## 2016-09-11 ENCOUNTER — Encounter (HOSPITAL_COMMUNITY): Payer: Self-pay | Admitting: *Deleted

## 2016-09-11 ENCOUNTER — Ambulatory Visit (HOSPITAL_COMMUNITY): Payer: BLUE CROSS/BLUE SHIELD | Admitting: Anesthesiology

## 2016-09-11 ENCOUNTER — Observation Stay (HOSPITAL_COMMUNITY)
Admission: RE | Admit: 2016-09-11 | Discharge: 2016-09-12 | Disposition: A | Discharge status: Home / Self Care | Payer: BLUE CROSS/BLUE SHIELD | Source: Ambulatory Visit | Attending: Neurosurgery | Admitting: Neurosurgery

## 2016-09-11 ENCOUNTER — Ambulatory Visit (HOSPITAL_COMMUNITY): Payer: BLUE CROSS/BLUE SHIELD

## 2016-09-11 DIAGNOSIS — Z419 Encounter for procedure for purposes other than remedying health state, unspecified: Secondary | ICD-10-CM

## 2016-09-11 DIAGNOSIS — M4722 Other spondylosis with radiculopathy, cervical region: Secondary | ICD-10-CM | POA: Diagnosis not present

## 2016-09-11 DIAGNOSIS — M1990 Unspecified osteoarthritis, unspecified site: Secondary | ICD-10-CM | POA: Insufficient documentation

## 2016-09-11 DIAGNOSIS — I440 Atrioventricular block, first degree: Secondary | ICD-10-CM | POA: Insufficient documentation

## 2016-09-11 DIAGNOSIS — Z87891 Personal history of nicotine dependence: Secondary | ICD-10-CM | POA: Insufficient documentation

## 2016-09-11 HISTORY — PX: ANTERIOR CERVICAL DECOMP/DISCECTOMY FUSION: SHX1161

## 2016-09-11 SURGERY — ANTERIOR CERVICAL DECOMPRESSION/DISCECTOMY FUSION 3 LEVELS
Anesthesia: General | Site: Neck

## 2016-09-11 MED ORDER — SODIUM CHLORIDE 0.9 % IV SOLN: 250.0000 mL | INTRAVENOUS | Status: DC

## 2016-09-11 MED ORDER — SUGAMMADEX SODIUM 200 MG/2ML IV SOLN
INTRAVENOUS | Status: DC | PRN
  Administered 2016-09-11: 200 mg via INTRAVENOUS

## 2016-09-11 MED ORDER — ACETAMINOPHEN 650 MG RE SUPP: 650.0000 mg | RECTAL | Status: DC | PRN

## 2016-09-11 MED ORDER — ROCURONIUM BROMIDE 100 MG/10ML IV SOLN
INTRAVENOUS | Status: DC | PRN
  Administered 2016-09-11 (×2): 50 mg via INTRAVENOUS

## 2016-09-11 MED ORDER — ONDANSETRON HCL 4 MG/2ML IJ SOLN: 4.0000 mg | INTRAMUSCULAR | Status: DC | PRN

## 2016-09-11 MED ORDER — POTASSIUM CHLORIDE IN NACL 20-0.9 MEQ/L-% IV SOLN
INTRAVENOUS | Status: DC
  Administered 2016-09-11: 22:00:00 via INTRAVENOUS
  Filled 2016-09-11: qty 1000

## 2016-09-11 MED ORDER — FENTANYL CITRATE (PF) 100 MCG/2ML IJ SOLN
INTRAMUSCULAR | Status: AC
  Administered 2016-09-11: 50 ug via INTRAVENOUS
  Filled 2016-09-11: qty 2

## 2016-09-11 MED ORDER — DIAZEPAM 5 MG PO TABS
ORAL_TABLET | ORAL | Status: AC
  Administered 2016-09-11: 5 mg via ORAL
  Filled 2016-09-11: qty 1

## 2016-09-11 MED ORDER — FENTANYL CITRATE (PF) 100 MCG/2ML IJ SOLN
INTRAMUSCULAR | Status: AC
  Filled 2016-09-11: qty 4

## 2016-09-11 MED ORDER — FENTANYL CITRATE (PF) 100 MCG/2ML IJ SOLN
INTRAMUSCULAR | Status: DC | PRN
  Administered 2016-09-11: 50 ug via INTRAVENOUS
  Administered 2016-09-11: 100 ug via INTRAVENOUS
  Administered 2016-09-11 (×5): 50 ug via INTRAVENOUS

## 2016-09-11 MED ORDER — CHLORHEXIDINE GLUCONATE CLOTH 2 % EX PADS: 6.0000 | MEDICATED_PAD | Freq: Once | CUTANEOUS | Status: DC

## 2016-09-11 MED ORDER — 0.9 % SODIUM CHLORIDE (POUR BTL) OPTIME
TOPICAL | Status: DC | PRN
  Administered 2016-09-11: 1000 mL

## 2016-09-11 MED ORDER — SUCCINYLCHOLINE CHLORIDE 20 MG/ML IJ SOLN
INTRAMUSCULAR | Status: DC | PRN
  Administered 2016-09-11: 120 mg via INTRAVENOUS

## 2016-09-11 MED ORDER — MIDAZOLAM HCL 5 MG/5ML IJ SOLN
INTRAMUSCULAR | Status: DC | PRN
  Administered 2016-09-11: 2 mg via INTRAVENOUS

## 2016-09-11 MED ORDER — MIDAZOLAM HCL 2 MG/2ML IJ SOLN
INTRAMUSCULAR | Status: AC
  Filled 2016-09-11: qty 2

## 2016-09-11 MED ORDER — ESMOLOL HCL 100 MG/10ML IV SOLN
INTRAVENOUS | Status: AC
Start: 2016-09-11 — End: 2016-09-11
  Filled 2016-09-11: qty 10

## 2016-09-11 MED ORDER — ACETAMINOPHEN 325 MG PO TABS: 650.0000 mg | ORAL_TABLET | ORAL | Status: DC | PRN

## 2016-09-11 MED ORDER — HYDROCODONE-ACETAMINOPHEN 5-325 MG PO TABS
1.0000 | ORAL_TABLET | ORAL | Status: DC | PRN
  Administered 2016-09-11: 2 via ORAL
  Filled 2016-09-11: qty 2

## 2016-09-11 MED ORDER — CEFAZOLIN SODIUM-DEXTROSE 2-4 GM/100ML-% IV SOLN
INTRAVENOUS | Status: AC
  Filled 2016-09-11: qty 100

## 2016-09-11 MED ORDER — ESMOLOL HCL 100 MG/10ML IV SOLN
INTRAVENOUS | Status: DC | PRN
Start: 2016-09-11 — End: 2016-09-11
  Administered 2016-09-11: 30 mg via INTRAVENOUS

## 2016-09-11 MED ORDER — MENTHOL 3 MG MT LOZG: 1.0000 | LOZENGE | OROMUCOSAL | Status: DC | PRN

## 2016-09-11 MED ORDER — SODIUM CHLORIDE 0.9% FLUSH: 3.0000 mL | INTRAVENOUS | Status: DC | PRN

## 2016-09-11 MED ORDER — DEXAMETHASONE 4 MG PO TABS
4.0000 mg | ORAL_TABLET | Freq: Two times a day (BID) | ORAL | Status: DC
  Administered 2016-09-11 – 2016-09-12 (×2): 4 mg via ORAL
  Filled 2016-09-11 (×2): qty 1

## 2016-09-11 MED ORDER — ONDANSETRON HCL 4 MG/2ML IJ SOLN
INTRAMUSCULAR | Status: DC | PRN
  Administered 2016-09-11: 4 mg via INTRAVENOUS

## 2016-09-11 MED ORDER — CEFAZOLIN SODIUM-DEXTROSE 2-4 GM/100ML-% IV SOLN
2.0000 g | INTRAVENOUS | Status: AC
  Administered 2016-09-11: 2 g via INTRAVENOUS

## 2016-09-11 MED ORDER — LIDOCAINE HCL (CARDIAC) 20 MG/ML IV SOLN
INTRAVENOUS | Status: DC | PRN
  Administered 2016-09-11: 100 mg via INTRAVENOUS

## 2016-09-11 MED ORDER — PROPOFOL 10 MG/ML IV BOLUS
INTRAVENOUS | Status: AC
  Filled 2016-09-11: qty 20

## 2016-09-11 MED ORDER — EPHEDRINE SULFATE 50 MG/ML IJ SOLN
INTRAMUSCULAR | Status: DC | PRN
  Administered 2016-09-11: 5 mg via INTRAVENOUS
  Administered 2016-09-11: 10 mg via INTRAVENOUS
  Administered 2016-09-11: 5 mg via INTRAVENOUS
  Administered 2016-09-11: 15 mg via INTRAVENOUS
  Administered 2016-09-11: 10 mg via INTRAVENOUS

## 2016-09-11 MED ORDER — DEXAMETHASONE SODIUM PHOSPHATE 4 MG/ML IJ SOLN: 4.0000 mg | Freq: Two times a day (BID) | INTRAMUSCULAR | Status: DC

## 2016-09-11 MED ORDER — HYDROCODONE-ACETAMINOPHEN 5-325 MG PO TABS
ORAL_TABLET | ORAL | Status: AC
  Administered 2016-09-11: 2 via ORAL
  Filled 2016-09-11: qty 2

## 2016-09-11 MED ORDER — THROMBIN 20000 UNITS EX SOLR
CUTANEOUS | Status: DC | PRN
  Administered 2016-09-11: 14:00:00 via TOPICAL

## 2016-09-11 MED ORDER — PROMETHAZINE HCL 25 MG/ML IJ SOLN: 6.2500 mg | INTRAMUSCULAR | Status: DC | PRN

## 2016-09-11 MED ORDER — SODIUM CHLORIDE 0.9% FLUSH: 3.0000 mL | Freq: Two times a day (BID) | INTRAVENOUS | Status: DC

## 2016-09-11 MED ORDER — OXYCODONE-ACETAMINOPHEN 5-325 MG PO TABS
1.0000 | ORAL_TABLET | ORAL | Status: DC | PRN
  Filled 2016-09-11: qty 2

## 2016-09-11 MED ORDER — PHENOL 1.4 % MT LIQD: 1.0000 | OROMUCOSAL | Status: DC | PRN

## 2016-09-11 MED ORDER — FENTANYL CITRATE (PF) 100 MCG/2ML IJ SOLN
25.0000 ug | INTRAMUSCULAR | Status: DC | PRN
  Administered 2016-09-11 (×2): 50 ug via INTRAVENOUS

## 2016-09-11 MED ORDER — LACTATED RINGERS IV SOLN
INTRAVENOUS | Status: DC
  Administered 2016-09-11 (×3): via INTRAVENOUS

## 2016-09-11 MED ORDER — THROMBIN 20000 UNITS EX SOLR
CUTANEOUS | Status: AC
  Filled 2016-09-11: qty 20000

## 2016-09-11 MED ORDER — ZOLPIDEM TARTRATE 5 MG PO TABS
10.0000 mg | ORAL_TABLET | Freq: Every evening | ORAL | Status: DC | PRN
  Administered 2016-09-11: 10 mg via ORAL
  Filled 2016-09-11: qty 2

## 2016-09-11 MED ORDER — LIDOCAINE-EPINEPHRINE 1 %-1:100000 IJ SOLN
INTRAMUSCULAR | Status: AC
  Filled 2016-09-11: qty 1

## 2016-09-11 MED ORDER — LIDOCAINE-EPINEPHRINE 0.5 %-1:200000 IJ SOLN
INTRAMUSCULAR | Status: DC | PRN
  Administered 2016-09-11: 4 mL

## 2016-09-11 MED ORDER — PROPOFOL 10 MG/ML IV BOLUS
INTRAVENOUS | Status: DC | PRN
  Administered 2016-09-11: 200 mg via INTRAVENOUS

## 2016-09-11 MED ORDER — SENNA 8.6 MG PO TABS
1.0000 | ORAL_TABLET | Freq: Two times a day (BID) | ORAL | Status: DC
  Administered 2016-09-11 – 2016-09-12 (×2): 8.6 mg via ORAL
  Filled 2016-09-11 (×2): qty 1

## 2016-09-11 MED ORDER — DIAZEPAM 5 MG PO TABS
5.0000 mg | ORAL_TABLET | Freq: Four times a day (QID) | ORAL | Status: DC | PRN
  Administered 2016-09-11: 5 mg via ORAL

## 2016-09-11 MED ORDER — DEXAMETHASONE SODIUM PHOSPHATE 10 MG/ML IJ SOLN
INTRAMUSCULAR | Status: DC | PRN
  Administered 2016-09-11 (×2): 5 mg via INTRAVENOUS

## 2016-09-11 SURGICAL SUPPLY — 79 items
ADH SKN CLS APL DERMABOND .7 (GAUZE/BANDAGES/DRESSINGS) ×1
BIT DRILL NEURO 2X3.1 SFT TUCH (MISCELLANEOUS) ×1 IMPLANT
BLADE CLIPPER SURG (BLADE) ×2 IMPLANT
BNDG GAUZE ELAST 4 BULKY (GAUZE/BANDAGES/DRESSINGS) IMPLANT
BUR DRUM 4.0 (BURR) ×2 IMPLANT
BUR DRUM 4.0MM (BURR) ×2
CANISTER SUCT 3000ML PPV (MISCELLANEOUS) ×3 IMPLANT
COUNTER NEEDLE 20 DBL MAG RED (NEEDLE) ×2 IMPLANT
DECANTER SPIKE VIAL GLASS SM (MISCELLANEOUS) ×3 IMPLANT
DERMABOND ADVANCED (GAUZE/BANDAGES/DRESSINGS) ×2
DERMABOND ADVANCED .7 DNX12 (GAUZE/BANDAGES/DRESSINGS) ×1 IMPLANT
DRAPE HALF SHEET 40X57 (DRAPES) ×4 IMPLANT
DRAPE LAPAROTOMY 100X72 PEDS (DRAPES) ×3 IMPLANT
DRAPE MICROSCOPE LEICA (MISCELLANEOUS) ×3 IMPLANT
DRAPE POUCH INSTRU U-SHP 10X18 (DRAPES) ×3 IMPLANT
DRILL NEURO 2X3.1 SOFT TOUCH (MISCELLANEOUS) ×3
DURAPREP 6ML APPLICATOR 50/CS (WOUND CARE) ×3 IMPLANT
ELECT COATED BLADE 2.86 ST (ELECTRODE) ×3 IMPLANT
ELECT REM PT RETURN 9FT ADLT (ELECTROSURGICAL) ×3
ELECTRODE REM PT RTRN 9FT ADLT (ELECTROSURGICAL) ×1 IMPLANT
GAUZE SPONGE 4X4 16PLY XRAY LF (GAUZE/BANDAGES/DRESSINGS) IMPLANT
GLOVE BIO SURGEON STRL SZ 6.5 (GLOVE) ×3 IMPLANT
GLOVE BIO SURGEON STRL SZ7 (GLOVE) IMPLANT
GLOVE BIO SURGEON STRL SZ7.5 (GLOVE) IMPLANT
GLOVE BIO SURGEON STRL SZ8 (GLOVE) ×2 IMPLANT
GLOVE BIO SURGEON STRL SZ8.5 (GLOVE) ×2 IMPLANT
GLOVE BIO SURGEONS STRL SZ 6.5 (GLOVE) ×3
GLOVE BIOGEL M 8.0 STRL (GLOVE) IMPLANT
GLOVE BIOGEL PI IND STRL 6.5 (GLOVE) IMPLANT
GLOVE BIOGEL PI IND STRL 7.0 (GLOVE) IMPLANT
GLOVE BIOGEL PI INDICATOR 6.5 (GLOVE) ×2
GLOVE BIOGEL PI INDICATOR 7.0 (GLOVE) ×2
GLOVE ECLIPSE 6.5 STRL STRAW (GLOVE) ×5 IMPLANT
GLOVE ECLIPSE 7.0 STRL STRAW (GLOVE) IMPLANT
GLOVE ECLIPSE 7.5 STRL STRAW (GLOVE) IMPLANT
GLOVE ECLIPSE 8.0 STRL XLNG CF (GLOVE) IMPLANT
GLOVE ECLIPSE 8.5 STRL (GLOVE) IMPLANT
GLOVE EXAM NITRILE LRG STRL (GLOVE) IMPLANT
GLOVE EXAM NITRILE XL STR (GLOVE) IMPLANT
GLOVE EXAM NITRILE XS STR PU (GLOVE) IMPLANT
GLOVE INDICATOR 6.5 STRL GRN (GLOVE) IMPLANT
GLOVE INDICATOR 7.0 STRL GRN (GLOVE) IMPLANT
GLOVE INDICATOR 7.5 STRL GRN (GLOVE) IMPLANT
GLOVE INDICATOR 8.0 STRL GRN (GLOVE) IMPLANT
GLOVE INDICATOR 8.5 STRL (GLOVE) ×2 IMPLANT
GLOVE OPTIFIT SS 8.0 STRL (GLOVE) IMPLANT
GLOVE SURG SS PI 6.5 STRL IVOR (GLOVE) ×6 IMPLANT
GOWN STRL REUS W/ TWL LRG LVL3 (GOWN DISPOSABLE) ×2 IMPLANT
GOWN STRL REUS W/ TWL XL LVL3 (GOWN DISPOSABLE) IMPLANT
GOWN STRL REUS W/TWL 2XL LVL3 (GOWN DISPOSABLE) IMPLANT
GOWN STRL REUS W/TWL LRG LVL3 (GOWN DISPOSABLE) ×9
GOWN STRL REUS W/TWL XL LVL3 (GOWN DISPOSABLE) ×3
HALTER HD/CHIN CERV TRACTION D (MISCELLANEOUS) ×1 IMPLANT
HEMOSTAT SURGICEL 2X14 (HEMOSTASIS) IMPLANT
KIT BASIN OR (CUSTOM PROCEDURE TRAY) ×5 IMPLANT
KIT ROOM TURNOVER OR (KITS) ×3 IMPLANT
NDL HYPO 25X1 1.5 SAFETY (NEEDLE) ×1 IMPLANT
NDL SPNL 22GX3.5 QUINCKE BK (NEEDLE) ×1 IMPLANT
NEEDLE HYPO 25X1 1.5 SAFETY (NEEDLE) ×3 IMPLANT
NEEDLE SPNL 22GX3.5 QUINCKE BK (NEEDLE) ×3 IMPLANT
NS IRRIG 1000ML POUR BTL (IV SOLUTION) ×3 IMPLANT
PACK LAMINECTOMY NEURO (CUSTOM PROCEDURE TRAY) ×3 IMPLANT
PAD ARMBOARD 7.5X6 YLW CONV (MISCELLANEOUS) ×5 IMPLANT
PIN DISTRACTION 14MM (PIN) ×6 IMPLANT
PIN TEMPORARY FIXATION (PIN) ×2 IMPLANT
PLATE HELIX T 62MM (Plate) ×2 IMPLANT
RUBBERBAND STERILE (MISCELLANEOUS) ×6 IMPLANT
SCREW HELIX T SELF TAP 15MM (Screw) ×16 IMPLANT
SPACER ACF PARALLEL 7MM (Bone Implant) ×2 IMPLANT
SPACER CC-ACF 8MM PARALLEL (Bone Implant) ×2 IMPLANT
SPACER PARALLEL 6MM CC ACF (Bone Implant) ×2 IMPLANT
SPONGE INTESTINAL PEANUT (DISPOSABLE) ×3 IMPLANT
SPONGE SURGIFOAM ABS GEL 100 (HEMOSTASIS) ×2 IMPLANT
SUT VIC AB 0 CT1 27 (SUTURE) ×3
SUT VIC AB 0 CT1 27XBRD ANTBC (SUTURE) ×1 IMPLANT
SUT VIC AB 3-0 SH 8-18 (SUTURE) ×5 IMPLANT
TOWEL OR 17X24 6PK STRL BLUE (TOWEL DISPOSABLE) ×3 IMPLANT
TOWEL OR 17X26 10 PK STRL BLUE (TOWEL DISPOSABLE) ×3 IMPLANT
WATER STERILE IRR 1000ML POUR (IV SOLUTION) ×5 IMPLANT

## 2016-09-11 NOTE — Progress Notes (Signed)
Patient received diaphoretic,very sleepy,sedated, not following commands. Gown changed and cooling system applied.

## 2016-09-11 NOTE — Progress Notes (Signed)
Patient admitted to 67C19. Patient is alert, orientedx4, incision on neck is clean, dry, intact, attached at edges, no drainage noted, per order, no brace needed. Will continue to monitor

## 2016-09-11 NOTE — Anesthesia Postprocedure Evaluation (Signed)
Anesthesia Post Note  Patient: Spencer Walker  Procedure(s) Performed: Procedure(s) (LRB): ANTERIOR CERVICAL DECOMPRESSION/DISCECTOMY FUSION Cervical four-five, Cervical five-six, Cervical six-seven (N/A)  Patient location during evaluation: PACU Anesthesia Type: General Level of consciousness: awake and alert Pain management: pain level controlled Vital Signs Assessment: post-procedure vital signs reviewed and stable Respiratory status: spontaneous breathing, nonlabored ventilation, respiratory function stable and patient connected to nasal cannula oxygen Cardiovascular status: blood pressure returned to baseline and stable Postop Assessment: no signs of nausea or vomiting Anesthetic complications: no    Last Vitals:  Vitals:   09/11/16 1845 09/11/16 1925  BP:  (!) 145/88  Pulse: 91 92  Resp: 16 20  Temp: 36.2 C 36.4 C    Last Pain:  Vitals:   09/11/16 1925  TempSrc: Oral  PainSc:                  Nilda Simmer

## 2016-09-11 NOTE — Transfer of Care (Signed)
Immediate Anesthesia Transfer of Care Note  Patient: Spencer Walker  Procedure(s) Performed: Procedure(s): ANTERIOR CERVICAL DECOMPRESSION/DISCECTOMY FUSION Cervical four-five, Cervical five-six, Cervical six-seven (N/A)  Patient Location: PACU  Anesthesia Type:General  Level of Consciousness: awake, alert  and patient cooperative  Airway & Oxygen Therapy: Patient Spontanous Breathing and Patient connected to nasal cannula oxygen  Post-op Assessment: Report given to RN and Post -op Vital signs reviewed and stable  Post vital signs: Reviewed and stable  Last Vitals:  Vitals:   09/11/16 0848  BP: (!) 169/97  Pulse: 71  Resp: 18  Temp: 36.5 C    Last Pain:  Vitals:   09/11/16 0848  TempSrc: Oral  PainSc:       Patients Stated Pain Goal: 3 ([id number])  Complications: No apparent anesthesia complications

## 2016-09-11 NOTE — H&P (Signed)
BP (!) 169/97   Pulse 71   Temp 97.7 F (36.5 C) (Oral)   Resp 18   Ht 6\' 2"  (1.88 m)   Wt 110.7 kg (244 lb)   SpO2 97%   BMI 31.33 kg/m  Spencer Walker is the brother of my other patient Spencer Walker. He comes in today for evaluation of pain that he is having in his upper extremities, right side worse than left. He has a great deal of neck pain. 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 He says his neck, back and arm hurt. Advil will help with the pain. He says the pain today is 6/10. He says he feels weakness in the neck, back and arms. His weight has been steady. REVIEW OF SYSTEMS: Review of systems outside of the neck pain is okay. PHYSICAL EXAMINATION: Vital signs: Height 6 feet 2 inches, weight 247.2 pounds, blood pressure is 137/91, temperature is 97.4, pulse 78, respiratory rate is 14. Pain is 5/10. He is, on examination, alert, oriented x 4, and answering all questions appropriately. Memory, language, attention span and fund of knowledge are normal. Speech is clear. It is also fluent. Strength is 5/5 in both upper and lower extremities. Normal muscle tone, bulk and coordination. He has 2+ reflexes biceps, triceps, brachioradialis, knees and ankles. Gait is antalgic, favoring the left lower extremity. He says when he sits he has to keep his head cocked forward, which is the posture that he does take. Proprioception is also intact.  IMAGING STUDIES: Plain x-rays cervical spine are reviewed. What it shows is significant spondylitic change at 5-6 and 6-7 and foraminal narrowing on the right and left sides at 5-6 and 6-7. An MRI done in 2010 of the cervical spine was reviewed and what that showed was a great deal of spondylitic change at C5-6, 6-7. He returned today so we could go over the MRI of the cervical spine. What it shows is that he has significant  spondylytic change present at 4-5, 5-6, and 6-7. I am not sure if 5-6 is fused, but there certainly is foraminal narrowing on the left side. This has progressed since an MRI which he had had a few years ago.   IMPRESSION/PLAN: Given the findings on the film and the fact that he has right-sided stenosis at 4-5 and the left side at 5-6 and 6-7, I do believe a three-level ACDF is indicated and warranted. He has had pain now for a number of years. He has had pain now for a number of years. He has had pain despite antiinflammatories and time. I have explained the risks and benefits of bleeding, infection, no relief, need for further surgery, fusion failure, hardware failure, damage to the recurrent laryngeal nerve causing hoarseness, damage to the spinal cord causing paralysis or possible weakness in one or both upper and lower extremities, possible bowel and bladder dysfunction, along with other risks. I also gave him a detailed sheet. He understands and wishes to proceed. We will try to get this done as soon as we can.

## 2016-09-11 NOTE — Anesthesia Procedure Notes (Signed)
Procedure Name: Intubation Date/Time: 09/11/2016 1:00 PM Performed by: Salli Quarry Zygmunt Mcglinn Pre-anesthesia Checklist: Patient identified, Emergency Drugs available, Suction available and Patient being monitored Patient Re-evaluated:Patient Re-evaluated prior to inductionOxygen Delivery Method: Circle System Utilized Preoxygenation: Pre-oxygenation with 100% oxygen Intubation Type: IV induction Ventilation: Mask ventilation without difficulty Laryngoscope Size: Mac and 4 Grade View: Grade I Tube type: Oral Tube size: 7.5 mm Number of attempts: 1 Airway Equipment and Method: Stylet Placement Confirmation: ETT inserted through vocal cords under direct vision,  positive ETCO2 and breath sounds checked- equal and bilateral Secured at: 26 cm Tube secured with: Tape Dental Injury: Teeth and Oropharynx as per pre-operative assessment

## 2016-09-11 NOTE — Op Note (Signed)
09/11/2016  4:54 PM  PATIENT:  Spencer Walker  60 y.o. male with spondylosis in the cervical spine.   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 Kristie Cowman was taken to the operating room, intubated, and placed under general anesthesia without difficulty. He was positioned supine with his head in slight extension on a horseshoe headrest. The neck was prepped and draped in a sterile manner. I infiltrated 4 cc's 1/2%lidocaine/1:200,000 strength epinephrine into the planned incision starting from the midline to the medial border of the left sternocleidomastoid muscle. I opened the incision with a 10 blade and dissected sharply through soft tissue to the platysma. I dissected in the plane superior to the platysma both rostrally and caudally. I then opened the platysma in a horizontal fashion with Metzenbaum scissors, and dissected in the inferior plane rostrally and caudally. With both blunt and sharp technique I created an avascular corridor to the cervical spine. I placed a spinal needle(s) in the disc space at 4/5 . I then reflected the longus colli from C4 to C7 and placed self retaining retractors. I opened the disc space(s) at 4/5,5/6,and 6/7 with a 15 blade. I removed disc with curettes, Kerrison punches, and the drill. Using the drill I removed  osteophytes and prepared for the decompression.  I decompressed the spinal canal and the C5,6,and 7 root(s) with the drill, Kerrison punches, and the curettes. I used the microscope to aid in microdissection. I removed the posterior longitudinal ligament to fully expose and decompress the thecal sac. I exposed the roots laterally taking down the 4/5,5/6,and 6/7 uncovertebral joints. With the decompression complete we moved on to the arthrodesis. We used the drill to level the surfaces of C4,5,6,and 7. I removed soft tissue to prepare the disc space and the bony surfaces. I measured the spaces and placed 6,7,and 72mm structural allografts into the disc spaces at 4/5,5/6,and 6/7 respectively.  We then placed the anterior instrumentation. I placed 2 screws in each vertebral body through the plate. I locked the screws into place. Intraoperative xray showed the graft, plate, and screws to be in good position. I irrigated the wound, achieved hemostasis, and closed the wound in layers. I approximated the platysma, and the subcuticular plane with vicryl sutures. I used Dermabond for a sterile dressing.   PLAN OF CARE: Admit to inpatient   PATIENT DISPOSITION:  PACU - hemodynamically stable.   Delay start of Pharmacological VTE agent (>24hrs) due to surgical blood loss or risk of bleeding:  yes

## 2016-09-12 DIAGNOSIS — M4722 Other spondylosis with radiculopathy, cervical region: Secondary | ICD-10-CM | POA: Diagnosis not present

## 2016-09-12 NOTE — Progress Notes (Signed)
Pt has not had any complications or pain. Pt tolerating meals, he expresses he would like to be discharge. Notified MD office, Caren Griffins, secretary noted that he is in surgery at the time of phone call [phone].

## 2016-09-12 NOTE — Care Management Note (Signed)
Case Management Note  Patient Details  Name: Spencer Walker MRN: [id number] Date of Birth: 05-27-1956  Subjective/Objective:     Pt underwent:    ANTERIOR CERVICAL DECOMPRESSION/DISCECTOMY FUSION Cervical four-five, Cervical five-six, Cervical six-seven. He is from home alone.             Action/Plan: Plan is to discharge home when medically ready. CM following for d/c needs.   Expected Discharge Date:                  Expected Discharge Plan:     In-House Referral:     Discharge planning Services     Post Acute Care Choice:    Choice offered to:     DME Arranged:    DME Agency:     HH Arranged:    HH Agency:     Status of Service:  In process, will continue to follow  If discussed at Long Length of Stay Meetings, dates discussed:    Additional Comments:  Pollie Friar, RN 09/12/2016, 1:17 PM

## 2016-09-12 NOTE — Discharge Summary (Signed)
BP 125/67 (BP Location: Left Arm)   Pulse 93   Temp 98.5 F (36.9 C) (Oral)   Resp 20   Ht 6\' 2"  (1.88 m)   Wt 110.7 kg (244 lb)   SpO2 98%   BMI 31.33 kg/m  Physician Discharge Summary  Patient ID: [name] MRN: [id number] DOB/AGE: [birth year] 60 y.o.  Admit date: 09/11/2016 Discharge date: 09/12/2016  Admission Diagnoses:osteoarthritis with radiculopathy, cervical  Discharge Diagnoses:  Active Problems:   Osteoarthritis of spine with radiculopathy, cervical region   Discharged Condition: good  Hospital Course: Spencer Walker is a 60 y.o. male whom was admitted and taken to the operating room for an uncomplicated ACDF at [id number], and 6/7. Post op he is voiding, ambulating, and tolerating a regular diet. He has normal strength in all extremities. Speaking voice is strong. Wound is clean, dry, and without signs of infection.   Treatments: surgery: Anterior Cervical decompression C4/5,5/6,6/7 Arthrodesis C4/5,5/6,6/7 with 33mm, 74mm, and 56mm structural allografts placed at 4/5,5/6,and 6/7 respectively Anterior instrumentation(Nuvasive translational plate helix) 579FGE   Discharge Exam: Blood pressure 125/67, pulse 93, temperature 98.5 F (36.9 C), temperature source Oral, resp. rate 20, height 6\' 2"  (1.88 m), weight 110.7 kg (244 lb), SpO2 98 %. General appearance: alert, cooperative, appears stated age and no distress  Disposition: 01-Home or Self Care CERVICAL SPONDYLOSIS WITH RADICULOPATHY    Medication List    TAKE these medications   ibuprofen 200 MG tablet Commonly known as:  ADVIL,MOTRIN Take 200 mg by mouth every 6 (six) hours as needed for moderate pain.   sildenafil 100 MG tablet Commonly known as:  VIAGRA 1/2 to one daily as needed for intercourse   zolpidem 10 MG tablet Commonly known as:  AMBIEN TAKE 1 TABLET AT BEDTIME AS NEEDED      Follow-up Information    Tamee Battin L, MD Follow up in 3 week(s).   Specialty:  Neurosurgery Why:   please call the office to make an appointment for 3 weeks Contact information: [address] [place] [address] [phone]           Signed: Winfield Cunas 09/12/2016, 1:42 PM

## 2016-09-12 NOTE — Progress Notes (Signed)
Patient A/O, no noted/denies distress/pain. Patient stated he felt well and ready to go home. He has not needed any pain regimen during the night. Staff will continue to monitor and meet needs. Educated patient on medications, tolerated well.

## 2016-09-12 NOTE — Progress Notes (Signed)
Educated patient on discharge instruction and follow up appointments. Staff safely transported him downstairs to meet family. Patient has all belongings and signed all documents.

## 2016-09-12 NOTE — Care Management Note (Signed)
Case Management Note  Patient Details  Name: Spencer Walker MRN: [id number] Date of Birth: 12/02/1956  Subjective/Objective:                    Action/Plan: Pt discharging home with self care. No further needs per CM.   Expected Discharge Date:                  Expected Discharge Plan:  Home/Self Care  In-House Referral:     Discharge planning Services     Post Acute Care Choice:    Choice offered to:     DME Arranged:    DME Agency:     HH Arranged:    [place] Agency:     Status of Service:  Completed, signed off  If discussed at [employer] of Stay Meetings, dates discussed:    Additional Comments:  Pollie Friar, RN 09/12/2016, 3:50 PM

## 2016-09-12 NOTE — Progress Notes (Signed)
Patient ambulated in hallway with standby assist from RN approximately 100 yards. Continue to monitor.

## 2016-09-12 NOTE — Discharge Instructions (Signed)

## 2016-09-16 ENCOUNTER — Encounter (HOSPITAL_COMMUNITY): Payer: Self-pay | Admitting: Neurosurgery

## 2016-10-07 ENCOUNTER — Other Ambulatory Visit: Payer: Self-pay | Admitting: Family Medicine

## 2016-10-07 NOTE — Telephone Encounter (Signed)
Please call in zolpidem  

## 2016-10-08 NOTE — Telephone Encounter (Signed)
Rx called in to pharmacy. 

## 2016-11-04 ENCOUNTER — Other Ambulatory Visit: Payer: Self-pay | Admitting: Family Medicine

## 2016-11-04 NOTE — Telephone Encounter (Signed)
Please advise 

## 2016-11-04 NOTE — Telephone Encounter (Signed)
OK to call #[phone] ambien with 1 refill.

## 2016-11-04 NOTE — Telephone Encounter (Signed)
Pt is staying in [place].  He needs a refill on   zolpidem (AMBIEN) 10 MG tablet  The rx needs to be sent to [address] [place] Their number is [phone]  He will be back in [place] in January.  His call back is [phone]  Thanks Con Memos

## 2016-11-08 MED ORDER — ZOLPIDEM TARTRATE 10 MG PO TABS: 10.0000 mg | ORAL_TABLET | Freq: Every evening | ORAL | 1 refills | Status: DC | PRN

## 2016-11-08 NOTE — Telephone Encounter (Signed)
RX called in-aa 

## 2016-11-26 ENCOUNTER — Other Ambulatory Visit: Payer: Self-pay | Admitting: Family Medicine

## 2016-11-26 NOTE — Telephone Encounter (Signed)
Pt is requesting the Rx for zolpidem (AMBIEN) 10 MG tablet resent to [facility] [id number] [phone].  CB#[phone]/MW

## 2016-11-27 MED ORDER — ZOLPIDEM TARTRATE 10 MG PO TABS: 10.0000 mg | ORAL_TABLET | Freq: Every evening | ORAL | 1 refills | Status: DC | PRN

## 2016-11-27 NOTE — Telephone Encounter (Signed)
See refill request.

## 2016-11-27 NOTE — Telephone Encounter (Signed)
This [facility] was called into [facility] in [place] on 11/08/2016. Patient did not get this filled before he left for [place]. Patient will be in [place] for 6-9 months for work and needs this prescription sent to the [facility] listed below. I called the prescription into [facility] in [place] and then called and cancelled the [facility] that was [facility]  [employer]. Patient advised that prescription has been called into the pharmacy.

## 2017-02-01 ENCOUNTER — Other Ambulatory Visit: Payer: Self-pay | Admitting: Family Medicine

## 2017-02-01 NOTE — Telephone Encounter (Signed)
Please call in zolpidem  

## 2017-02-03 NOTE — Telephone Encounter (Signed)
Called in. Spencer Walker, CMA  

## 2017-02-07 ENCOUNTER — Other Ambulatory Visit (HOSPITAL_COMMUNITY): Payer: Self-pay | Admitting: Neurosurgery

## 2017-02-07 DIAGNOSIS — R1319 Other dysphagia: Secondary | ICD-10-CM

## 2017-02-13 ENCOUNTER — Ambulatory Visit (HOSPITAL_COMMUNITY): Payer: BLUE CROSS/BLUE SHIELD

## 2017-02-13 ENCOUNTER — Other Ambulatory Visit (HOSPITAL_COMMUNITY): Payer: BLUE CROSS/BLUE SHIELD

## 2017-05-19 ENCOUNTER — Other Ambulatory Visit: Payer: Self-pay | Admitting: Family Medicine

## 2017-05-21 ENCOUNTER — Other Ambulatory Visit: Payer: Self-pay | Admitting: Family Medicine

## 2017-05-27 ENCOUNTER — Other Ambulatory Visit: Payer: Self-pay | Admitting: Family Medicine

## 2017-05-27 NOTE — Telephone Encounter (Signed)
Rx called in to pharmacy. 

## 2017-05-27 NOTE — Telephone Encounter (Signed)
Patient was notified.

## 2017-05-27 NOTE — Telephone Encounter (Signed)
Please call in zolpidem  Patient is due for ov or CPE

## 2018-03-05 IMAGING — MR MR CERVICAL SPINE W/O CM
4 of 5 series · 14 of 48 positions shown · non-contrast
Comparison: 02/27/2009

CLINICAL DATA: Neck pain radiating down both arms

EXAM:
MRI CERVICAL SPINE WITHOUT CONTRAST
TECHNIQUE: Multiplanar, multisequence MR imaging of the cervical spine was
performed. No intravenous contrast was administered.

[Series 3: T2 · sagittal · 3.0mm · 0.47mm/px · 5 of 13 slices shown (1 of 2)]
[im 1/13]
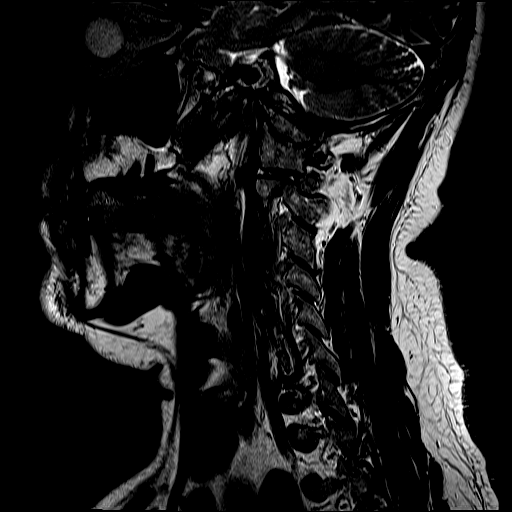
[im 4/13]
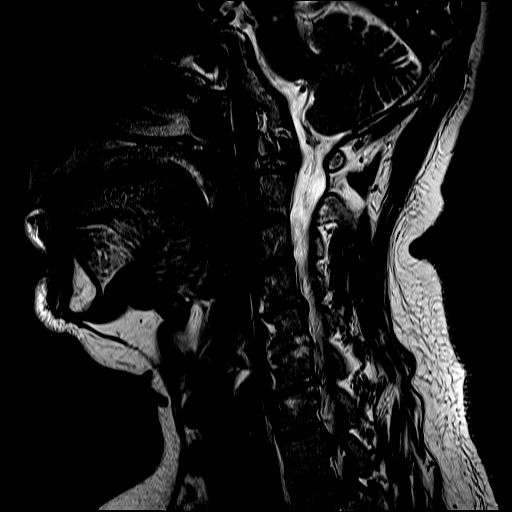
[im 7/13]
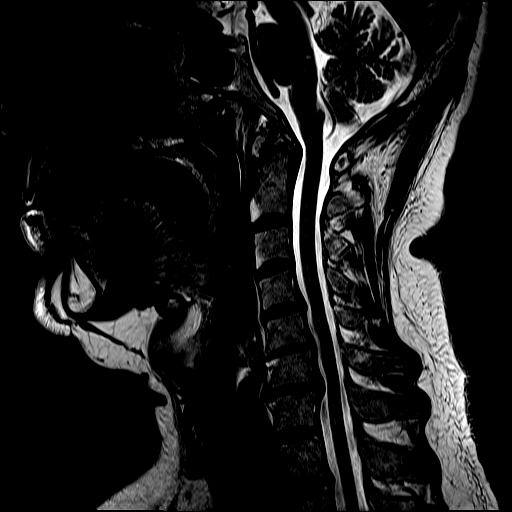
[im 10/13]
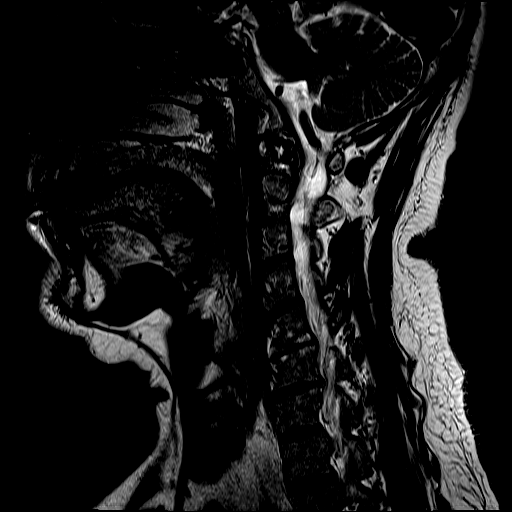
[im 13/13]
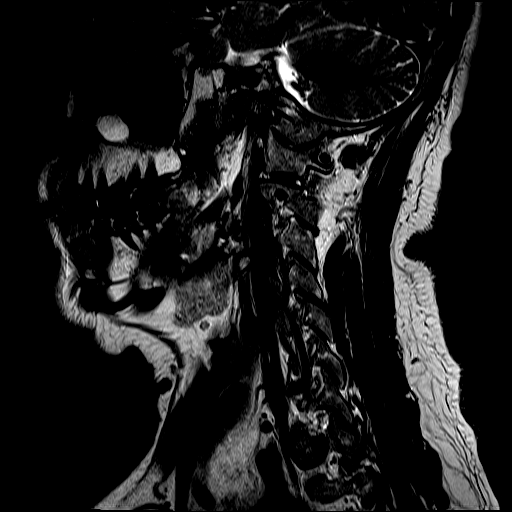

[Series 4: FLAIR · sagittal · 3.0mm · 0.49mm/px · 3 of 13 slices shown]
[im 1/13]
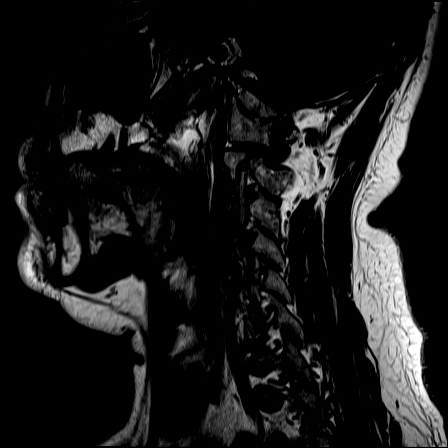
[im 7/13]
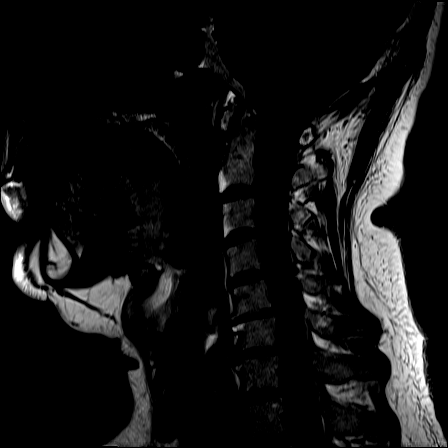
[im 13/13]
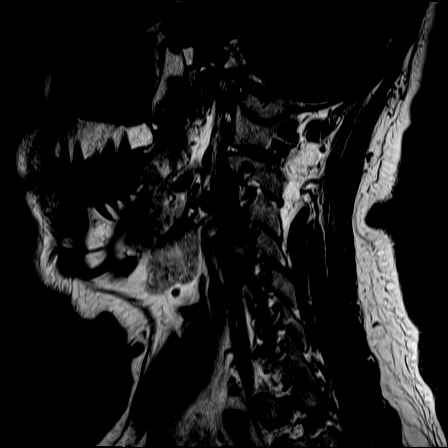

[Series 5: ir sagital · sagittal · 3.0mm · 0.27mm/px · 3 of 13 slices shown]
[im 3/13]
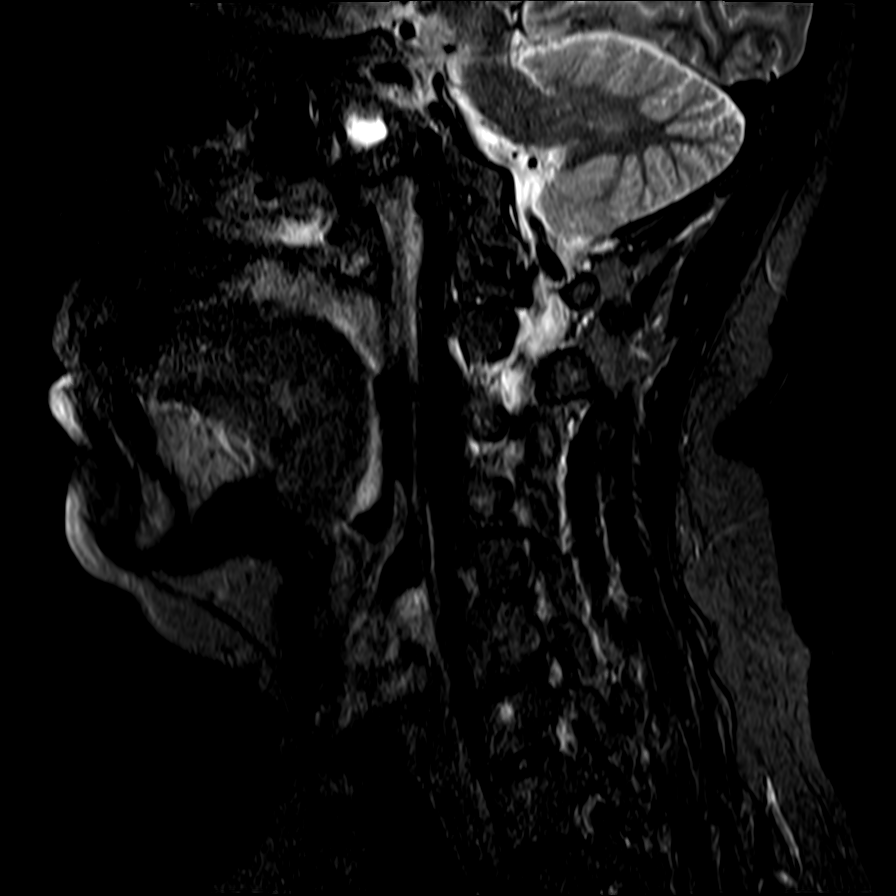
[im 8/13]
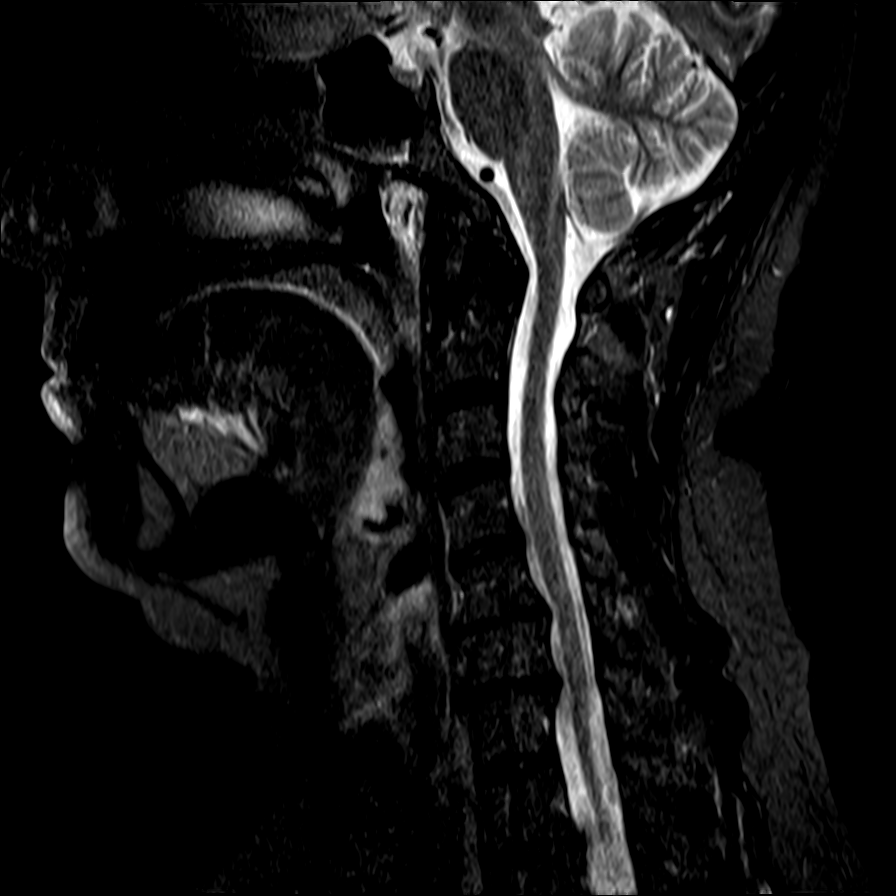
[im 13/13]
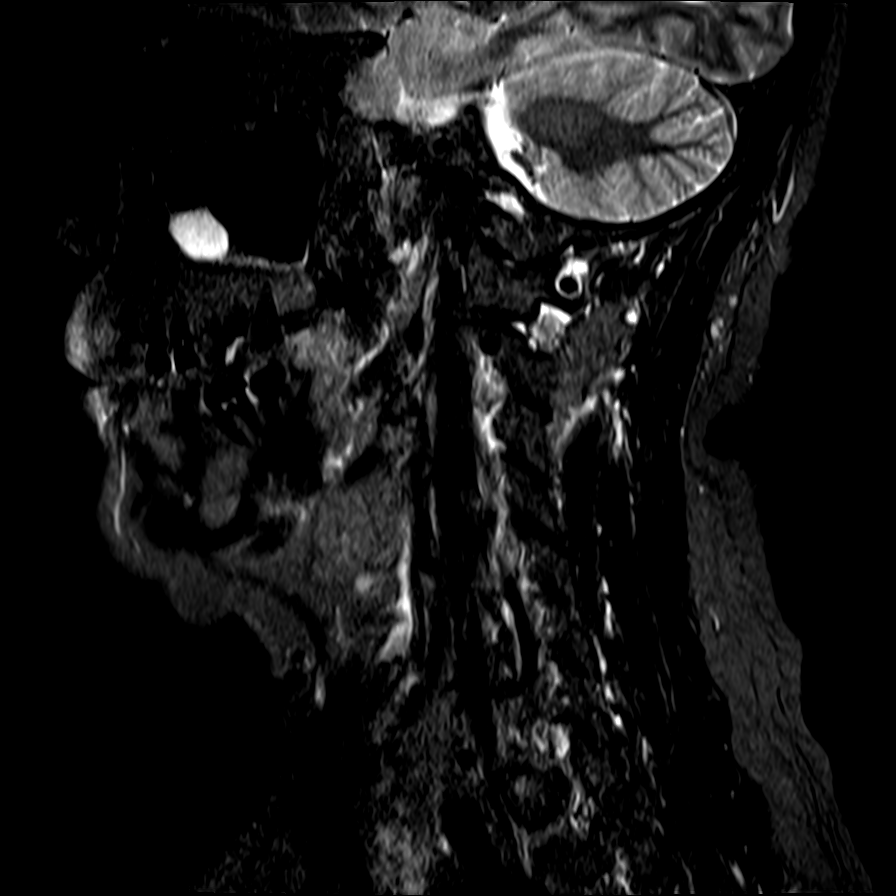

[Series 7: T2 · axial · 3.0mm · 0.22mm/px · z∈[-87,-4]mm · 3 of 36 slices shown (2 of 2)]
[im 5/36]
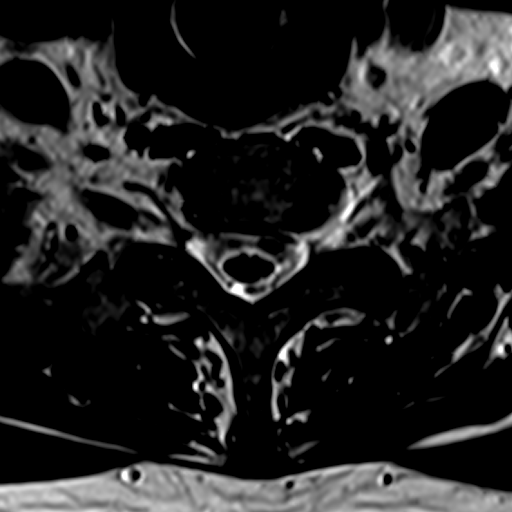
[im 19/36]
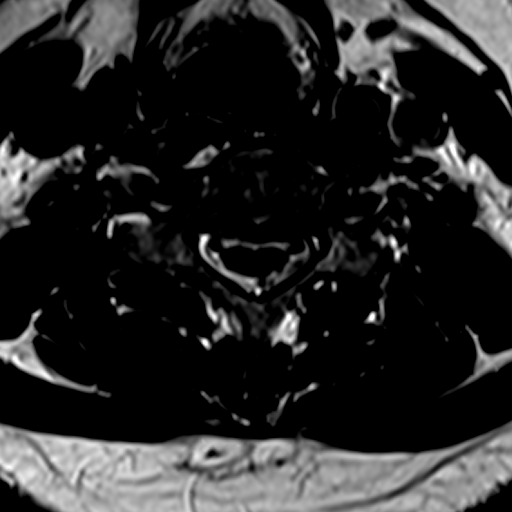
[im 31/36]
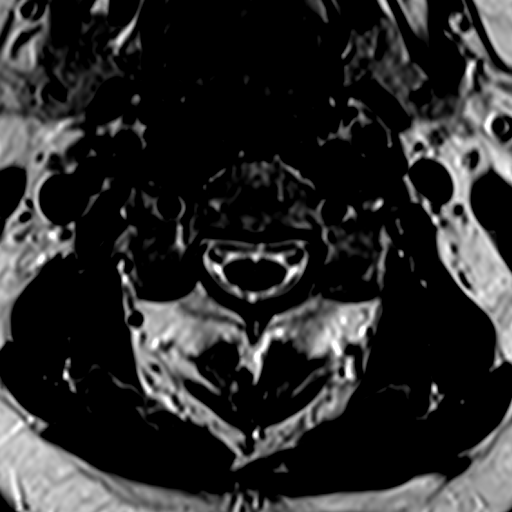

[14 of 48 positions shown; findings below may reference images not displayed]

FINDINGS: Alignment: Physiologic.

Vertebrae: No fracture, evidence of discitis, or bone lesion.

Cord: Normal signal and morphology.

Posterior Fossa, vertebral arteries, paraspinal tissues: Negative.

Disc levels:

Discs: Degenerative disc disease with disc height loss at C5-6 and
C6-7.

C2-3: No significant disc bulge. No neural foraminal stenosis. No
central canal stenosis.

C3-4: No significant disc bulge. No neural foraminal stenosis. No
central canal stenosis.

C4-5: Broad right paracentral disc protrusion. Moderate right
foraminal stenosis. No left foraminal stenosis. No central canal
stenosis.

C5-6: Broad-based disc osteophyte complex. Moderate bilateral
foraminal stenosis. No central canal stenosis.

C6-7: Central disc protrusion impressing on the thecal sac. Severe
left foraminal stenosis. No central canal stenosis.

C7-T1: No significant disc bulge. No neural foraminal stenosis. No
central canal stenosis.
IMPRESSION: 1. At C6-7 there is a central disc protrusion impressing on the
thecal sac. Severe left foraminal stenosis.
2. At C4-5 there is a broad right paracentral disc protrusion.
Moderate right foraminal stenosis.
3. At C5-6 there is a mild broad-based disc osteophyte complex with
moderate bilateral foraminal stenosis.
